# Patient Record
Sex: Female | Born: 1985 | Race: Black or African American | Hispanic: No | Marital: Married | Smoking: Never smoker
Health system: Southern US, Community
[De-identification: ages and names within clinical notes are randomized; demographics above are authoritative.]

## PROBLEM LIST (undated history)

## (undated) DIAGNOSIS — F321 Major depressive disorder, single episode, moderate: Secondary | ICD-10-CM

## (undated) DIAGNOSIS — K219 Gastro-esophageal reflux disease without esophagitis: Secondary | ICD-10-CM

## (undated) DIAGNOSIS — R011 Cardiac murmur, unspecified: Secondary | ICD-10-CM

## (undated) DIAGNOSIS — M549 Dorsalgia, unspecified: Secondary | ICD-10-CM

## (undated) DIAGNOSIS — R079 Chest pain, unspecified: Secondary | ICD-10-CM

## (undated) DIAGNOSIS — R002 Palpitations: Secondary | ICD-10-CM

## (undated) DIAGNOSIS — F32A Depression, unspecified: Secondary | ICD-10-CM

## (undated) DIAGNOSIS — F419 Anxiety disorder, unspecified: Secondary | ICD-10-CM

## (undated) DIAGNOSIS — E559 Vitamin D deficiency, unspecified: Secondary | ICD-10-CM

## (undated) DIAGNOSIS — K5792 Diverticulitis of intestine, part unspecified, without perforation or abscess without bleeding: Secondary | ICD-10-CM

## (undated) DIAGNOSIS — K829 Disease of gallbladder, unspecified: Secondary | ICD-10-CM

## (undated) DIAGNOSIS — K5900 Constipation, unspecified: Secondary | ICD-10-CM

## (undated) HISTORY — DX: Depression, unspecified: F32.A

## (undated) HISTORY — DX: Cardiac murmur, unspecified: R01.1

## (undated) HISTORY — DX: Chest pain, unspecified: R07.9

## (undated) HISTORY — DX: Diverticulitis of intestine, part unspecified, without perforation or abscess without bleeding: K57.92

## (undated) HISTORY — PX: CHOLECYSTECTOMY: SHX55

## (undated) HISTORY — DX: Palpitations: R00.2

## (undated) HISTORY — DX: Dorsalgia, unspecified: M54.9

## (undated) HISTORY — DX: Disease of gallbladder, unspecified: K82.9

## (undated) HISTORY — DX: Major depressive disorder, single episode, moderate: F32.1

## (undated) HISTORY — DX: Gastro-esophageal reflux disease without esophagitis: K21.9

## (undated) HISTORY — DX: Constipation, unspecified: K59.00

## (undated) HISTORY — DX: Anxiety disorder, unspecified: F41.9

## (undated) HISTORY — DX: Vitamin D deficiency, unspecified: E55.9

---
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
## 2006-07-15 ENCOUNTER — Ambulatory Visit (HOSPITAL_COMMUNITY): Admission: RE | Admit: 2006-07-15 | Discharge: 2006-07-15 | Payer: Self-pay | Admitting: Obstetrics

## 2006-08-05 ENCOUNTER — Ambulatory Visit (HOSPITAL_COMMUNITY): Admission: RE | Admit: 2006-08-05 | Discharge: 2006-08-05 | Payer: Self-pay | Admitting: Obstetrics

## 2007-01-11 ENCOUNTER — Inpatient Hospital Stay (HOSPITAL_COMMUNITY): Admission: RE | Admit: 2007-01-11 | Discharge: 2007-01-15 | Payer: Self-pay | Admitting: Obstetrics

## 2007-03-03 ENCOUNTER — Emergency Department (HOSPITAL_COMMUNITY): Admission: EM | Admit: 2007-03-03 | Discharge: 2007-03-03 | Payer: Self-pay | Admitting: Emergency Medicine

## 2007-05-07 ENCOUNTER — Ambulatory Visit (HOSPITAL_COMMUNITY): Admission: RE | Admit: 2007-05-07 | Discharge: 2007-05-07 | Payer: Self-pay | Admitting: General Surgery

## 2007-05-07 ENCOUNTER — Encounter (INDEPENDENT_AMBULATORY_CARE_PROVIDER_SITE_OTHER): Payer: Self-pay | Admitting: General Surgery

## 2009-06-13 ENCOUNTER — Emergency Department (HOSPITAL_COMMUNITY): Admission: EM | Admit: 2009-06-13 | Discharge: 2009-06-13 | Payer: Self-pay | Admitting: Emergency Medicine

## 2009-07-25 ENCOUNTER — Emergency Department (HOSPITAL_COMMUNITY): Admission: EM | Admit: 2009-07-25 | Discharge: 2009-07-25 | Payer: Self-pay | Admitting: Emergency Medicine

## 2010-10-28 LAB — RAPID STREP SCREEN (MED CTR MEBANE ONLY): Streptococcus, Group A Screen (Direct): POSITIVE — AB

## 2010-12-10 NOTE — Op Note (Signed)
NAMEVERLINE, Laura Mejia            ACCOUNT NO.:  [id number]   MEDICAL RECORD NO.:  [id number]          PATIENT TYPE:  AMB   LOCATION:  SDS                          FACILITY:  [facility]   PHYSICIAN:  Cherylynn Ridges, M.D.    DATE OF BIRTH:  March 19, 1986   DATE OF PROCEDURE:  05/07/2007  DATE OF DISCHARGE:  05/07/2007                               OPERATIVE REPORT   PREOPERATIVE DIAGNOSES:  Symptomatic cholelithiasis.   POSTOPERATIVE DIAGNOSES:  Symptomatic cholelithiasis.   PROCEDURE:  Laparoscopic cholecystectomy with cholangiogram.   SURGEON:  Dr. Lindie Spruce.   ASSISTANT:  Dr. Carolynne Edouard.   ANESTHESIA:  General endotracheal.   ESTIMATED BLOOD LOSS:  Less than 20 mL.   COMPLICATIONS:  None.   CONDITION:  Stable.   FINDINGS:  The patient had a gallbladder packed with gallstones.  Cholangiogram was normal.  No evidence of obstruction, filling defects  or dilatation.  She had good proximal flow.   INDICATIONS FOR OPERATION:  The patient is a 25 year old with  symptomatic gallstones postpartum who now comes in for a laparoscopic  cholecystectomy.   DESCRIPTION OF PROCEDURE:  The patient was taken to the operating room,  placed on the table in supine position.  After an adequate endotracheal  anesthetic was administered, she was prepped and draped in the usual  sterile manner exposing the midline and the right upper quadrant.   A supraumbilical curvilinear incision was made using a #11 blade and  taken down to the midline fascia.  We were able to bluntly dissect down  into the peritoneal cavity. We grabbed the fascial edges and  subsequently passed a pursestring suture of #0 Vicryl around the fascial  opening.  We then passed a Hassan cannula into the peritoneal cavity  with minimal difficulty, securing it in place with a pursestring suture.  Once this was done, carbon dioxide gas was instilled through the [facility]  cannula into the peritoneal cavity up to a maximal intra-abdominal  pressure  of 15 mmHg.  Once this was done, two right costal margin 5-mm  cannulas and a subxiphoid 12-mm cannula were passed under direct vision.  With all of these in place, the patient was placed in reverse  Trendelenburg, the left-side was tilted down and the dissection begun.   We retracted the gallbladder towards the right upper quadrant and the  anterior abdominal wall using graspers and then dissected out the  peritoneum overlying the triangle of Calot and hepatoduodenal triangle.  We isolated the cystic duct and the cystic artery, clipped the cystic  artery proximally and distally and transected it.  We placed a clip  along the gallbladder side of the cystic duct and made  cholecystodochotomy through which a Cook catheter which had been passed  through the anterior abdominal wall was used to cannulate to perform the  cholangiogram.  The cholangiogram showed good flow into the duodenum, no  obstruction, no dilatation of stones.   Once the cholangiogram was completed, we removed the clip securing  it  in place, removed the catheter and distally clipped the cystic duct x2.  We transected the cystic duct and dissected  out the gallbladder from its  bed with minimal difficulty.  We brought it up to the fascial level  where we opened the gallbladder and retrieve a large number of stones  which were impeding its extraction.  Once this was done, the gallbladder  was removed, we irrigated with a small amount of saline and removed all  fluid and gas from above the liver.  Once this was done all cannulas  were removed.  We closed the supraumbilical site with the #0 Vicryl  suture which was then placed with a pursestring and we actually placed  two additional simple stitches for closure.  Once this was done we  injected 0.25% Marcaine with epi at all sites.  We closed the skin at  the supraumbilical and the subxiphoid sites using running subcuticular  stitch of 4-0 Vicryl and then we closed the skin  using Dermabond at all  sites. A full dressing including Dermabond, Steri-Strips and Tegaderm.  All counts were correct.      Cherylynn Ridges, M.D.  Electronically Signed     JOW/MEDQ  D:  05/07/2007  T:  05/08/2007  Job:  [id number]

## 2010-12-10 NOTE — H&P (Signed)
NAME:  Laura Mejia, EK NO.:  [id number]   MEDICAL RECORD NO.:  [id number]          PATIENT TYPE:  INP   LOCATION:  9103                          FACILITY:  [facility]   PHYSICIAN:  Kathreen Cosier, M.D.DATE OF BIRTH:  1985-10-09   DATE OF ADMISSION:  01/11/2007  DATE OF DISCHARGE:                              HISTORY & PHYSICAL   The patient is a 25 year old gravida 2, para 1-0-0-1, [facility] January 06, 2007.  She desired induction post dates. Negative GBS. Cervix 1 cm, 70%,  vertex, -2 to 3 station. Membranes were ruptured at 7:10 a.m.  artificially. The fluid was clear, and she was having irregular  contractions. She was started on low-dose Pitocin and took the maximum  of 4 doses. By 5 p.m., she was 9 cm, 100%, vertex, -1. She was on 2  milliunits of Pitocin. Temperature was 100.5. She was started on  ampicillin 2 grams IV every 6 hours. By 11:30 p.m., she had a rim  anteriorly and on the left side, and she had previously tried attempt at  pushing, and cervix could not be reduced. We restarted pushing at 11  p.m. and pushed for an hour. The cervix was reduced, but the vertex  would not come below 0 station. It was decided she would be delivered by  cesarean section for failure to progress.   PHYSICAL EXAMINATION:  Revealed a well-developed female in no distress.  HEENT:  Negative.  LUNGS:  Clear.  HEART:  Regular rhythm. No murmurs. No gallops.  BREASTS:  No masses.  ABDOMEN:  __________  estimated fetal weight of 6 pounds 14 ounces.  EXTREMITIES:  Negative.           ______________________________  Kathreen Cosier, M.D.     BAM/MEDQ  D:  01/12/2007  T:  01/12/2007  Job:  [id number]

## 2010-12-10 NOTE — Op Note (Signed)
NAMEMarland Kitchen  Mejia, Laura Mejia NO.:  [id number]   MEDICAL RECORD NO.:  [id number]          PATIENT TYPE:  INP   LOCATION:  9103                          FACILITY[facility]  [facility]   PHYSICIAN:  Kathreen Cosier, M.D.DATE OF BIRTH:  [birth year]   DATE OF PROCEDURE:  01/12/2007  DATE OF DISCHARGE:                               OPERATIVE REPORT   SURGEON:  Dr. Gaynell Face.   ANESTHESIA:  Epidural.   PROCEDURE:  The patient placed on the operating table in supine  position. Abdomen prepped and draped. Bladder emptied with a Foley  catheter. Transverse suprapubic incision made, carried down to the  rectus fascia. Fascia cleanly incised the length of the incision. The  recti muscles were retracted laterally. Peritoneum incised  longitudinally. Transverse incision made in the visceral peritoneum  above the bladder. Bladder mobilized inferiorly. Transverse lower  uterine incision made. The patient delivered from the OP position.  Apgars of 8 and 9, weighing 8 pounds 8 ounces. The team was in  attendance. The fluid was clear. Placenta was posterior, removed  manually and sent to labor and delivery. The uterine cavity was cleaned  with dry laps. The uterine incision closed in one layer with continuous  suture of #1 chromic. Hemostasis satisfactory. Bladder flap reattached  with 2-0 chromic. Uterus __________  tubes and ovaries normal. Abdomen  closed in layers, peritoneum continuous suture of 0 chromic, fascia  continuous suture of 0 Dexon and the skin closed with subcuticular  stitch of 4-0 Monocryl. Blood loss 500 mL. The patient tolerated the  procedure well, taken to the recovery room in good condition.           ______________________________  Kathreen Cosier, M.D.     BAM/MEDQ  D:  01/12/2007  T:  01/12/2007  Job:  [id number]

## 2010-12-13 NOTE — Discharge Summary (Signed)
NAME:  Laura Mejia, TEP NO.:  [id number]   MEDICAL RECORD NO.:  [id number]          PATIENT TYPE:  INP   LOCATION:  9103                          [facility]ACILITY[facility]  [facility]   PHYSICIAN:  Kathreen Cosier, M.D.DATE OF BIRTH:  Dec 11, 1985   DATE OF ADMISSION:  01/11/2007  DATE OF DISCHARGE:  01/15/2007                               DISCHARGE SUMMARY   The patient is a 25 year old gravida 2, para 1-0-0-1, [facility] January 06, 2007  desired induction at term, negative GBS.  Cervix 1 cm, 70%, vertex, -2  to -3.  Membranes ruptured artificially. She was having irregular  contractions and the patient progressed to full dilatation, 0 station  and pushed for a total of 145 minutes with no descend below 0 station.   HOSPITAL COURSE:  The patient was delivered by C-section and had a female,  Apgar 08/09, weighing 8 pounds 8 ounces from the OP position.  Blood  loss was 500 mL.  The patient did well.  Her hemoglobin was 6.7.  She  was given ferrous sulfate twice a day and was discharged home on the  third postoperative day on ferrous sulfate 250 mg p.o. b.i.d. and Tylox  for pain.   DISCHARGE DIAGNOSIS:  Status post primary low transverse cesarean  section at term, failed induction of labor.           ______________________________  Kathreen Cosier, M.D.     BAM/MEDQ  D:  02/10/2007  T:  02/10/2007  Job:  [id number]

## 2010-12-28 ENCOUNTER — Emergency Department (HOSPITAL_COMMUNITY)
Admission: EM | Admit: 2010-12-28 | Discharge: 2010-12-28 | Disposition: A | Discharge status: Home / Self Care | Payer: Medicaid Other | Attending: Emergency Medicine | Admitting: Emergency Medicine

## 2010-12-28 DIAGNOSIS — R51 Headache: Secondary | ICD-10-CM | POA: Insufficient documentation

## 2010-12-28 DIAGNOSIS — H6120 Impacted cerumen, unspecified ear: Secondary | ICD-10-CM | POA: Insufficient documentation

## 2010-12-28 DIAGNOSIS — H9209 Otalgia, unspecified ear: Secondary | ICD-10-CM | POA: Insufficient documentation

## 2011-05-08 LAB — DIFFERENTIAL
Basophils Absolute: 0.1
Basophils Relative: 1
Eosinophils Absolute: 0.3
Eosinophils Relative: 4
Lymphocytes Relative: 40
Lymphs Abs: 2.7
Monocytes Absolute: 0.5
Monocytes Relative: 7
Neutro Abs: 3.1
Neutrophils Relative %: 48

## 2011-05-08 LAB — CBC
HCT: 32.7 — ABNORMAL LOW
Hemoglobin: 10.1 — ABNORMAL LOW
MCHC: 31
MCV: 63.1 — ABNORMAL LOW
Platelets: 476 — ABNORMAL HIGH
RBC: 5.18 — ABNORMAL HIGH
RDW: 18.9 — ABNORMAL HIGH
WBC: 6.7

## 2011-05-08 LAB — COMPREHENSIVE METABOLIC PANEL
ALT: 23
AST: 25
Albumin: 4.1
Alkaline Phosphatase: 44
BUN: 9
CO2: 26
Calcium: 9.9
Chloride: 104
Creatinine, Ser: 0.81
GFR calc Af Amer: >60
GFR calc non Af Amer: >60
Glucose, Bld: 82
Potassium: 4.3
Sodium: 138
Total Bilirubin: 0.5
Total Protein: 7.2

## 2011-05-12 LAB — CBC
HCT: 29.3 — ABNORMAL LOW
Hemoglobin: 9.4 — ABNORMAL LOW
MCHC: 32
MCV: 62.9 — ABNORMAL LOW
Platelets: 511 — ABNORMAL HIGH
RBC: 4.66
RDW: 18.2 — ABNORMAL HIGH
WBC: 8.2

## 2011-05-12 LAB — COMPREHENSIVE METABOLIC PANEL
ALT: 21
AST: 20
Albumin: 3.9
Alkaline Phosphatase: 49
BUN: 10
CO2: 24
Calcium: 9.5
Chloride: 104
Creatinine, Ser: 0.78
GFR calc Af Amer: >60
GFR calc non Af Amer: >60
Glucose, Bld: 87
Potassium: 4
Sodium: 137
Total Bilirubin: 0.2 — ABNORMAL LOW
Total Protein: 7.4

## 2011-05-12 LAB — PREGNANCY, URINE: Preg Test, Ur: NEGATIVE

## 2011-05-12 LAB — URINALYSIS, ROUTINE W REFLEX MICROSCOPIC
Bilirubin Urine: NEGATIVE
Glucose, UA: NEGATIVE
Hgb urine dipstick: NEGATIVE
Nitrite: NEGATIVE
Protein, ur: NEGATIVE
Specific Gravity, Urine: 1.018
Urobilinogen, UA: 0.2
pH: 6

## 2011-05-12 LAB — DIFFERENTIAL
Basophils Absolute: 0.1
Basophils Relative: 1
Eosinophils Absolute: 0.2
Eosinophils Relative: 2
Lymphocytes Relative: 33
Lymphs Abs: 2.7
Monocytes Absolute: 0.4
Monocytes Relative: 5
Neutro Abs: 4.8
Neutrophils Relative %: 59

## 2011-05-12 LAB — AMYLASE: Amylase: 54

## 2011-05-12 LAB — LIPASE, BLOOD: Lipase: 23

## 2011-05-12 LAB — URINE MICROSCOPIC-ADD ON

## 2011-05-14 LAB — CBC
HCT: 20.9 — ABNORMAL LOW
HCT: 26.8 — ABNORMAL LOW
Hemoglobin: 6.7 — CL
Hemoglobin: 8.6 — ABNORMAL LOW
MCHC: 31.9
MCHC: 32.2
MCV: 65.5 — ABNORMAL LOW
MCV: 66.2 — ABNORMAL LOW
Platelets: 321
Platelets: 446 — ABNORMAL HIGH
RBC: 3.16 — ABNORMAL LOW
RBC: 4.09
RDW: 18.3 — ABNORMAL HIGH
RDW: 19.2 — ABNORMAL HIGH
WBC: 11.3 — ABNORMAL HIGH
WBC: 8.3

## 2011-05-14 LAB — CCBB MATERNAL DONOR DRAW

## 2011-05-14 LAB — RPR: RPR Ser Ql: NONREACTIVE

## 2011-09-09 ENCOUNTER — Other Ambulatory Visit: Payer: Self-pay

## 2011-09-09 ENCOUNTER — Encounter (HOSPITAL_COMMUNITY): Payer: Self-pay | Admitting: *Deleted

## 2011-09-09 ENCOUNTER — Emergency Department (HOSPITAL_COMMUNITY): Payer: Medicaid Other

## 2011-09-09 ENCOUNTER — Emergency Department (HOSPITAL_COMMUNITY)
Admission: EM | Admit: 2011-09-09 | Discharge: 2011-09-09 | Disposition: A | Discharge status: Home / Self Care | Payer: Medicaid Other | Attending: Emergency Medicine | Admitting: Emergency Medicine

## 2011-09-09 DIAGNOSIS — R0989 Other specified symptoms and signs involving the circulatory and respiratory systems: Secondary | ICD-10-CM | POA: Insufficient documentation

## 2011-09-09 DIAGNOSIS — R002 Palpitations: Secondary | ICD-10-CM | POA: Insufficient documentation

## 2011-09-09 DIAGNOSIS — R079 Chest pain, unspecified: Secondary | ICD-10-CM | POA: Insufficient documentation

## 2011-09-09 DIAGNOSIS — R0602 Shortness of breath: Secondary | ICD-10-CM | POA: Insufficient documentation

## 2011-09-09 DIAGNOSIS — R0609 Other forms of dyspnea: Secondary | ICD-10-CM | POA: Insufficient documentation

## 2011-09-09 LAB — POCT I-STAT TROPONIN I: Troponin i, poc: 0 ng/mL (ref 0.00–0.08)

## 2011-09-09 LAB — DIFFERENTIAL
Basophils Absolute: 0 10*3/uL (ref 0.0–0.1)
Basophils Relative: 0 % (ref 0–1)
Eosinophils Absolute: 0.2 10*3/uL (ref 0.0–0.7)
Eosinophils Relative: 2 % (ref 0–5)
Lymphocytes Relative: 31 % (ref 12–46)
Lymphs Abs: 3.4 10*3/uL (ref 0.7–4.0)
Monocytes Absolute: 0.6 10*3/uL (ref 0.1–1.0)
Monocytes Relative: 6 % (ref 3–12)
Neutro Abs: 6.8 10*3/uL (ref 1.7–7.7)
Neutrophils Relative %: 61 % (ref 43–77)

## 2011-09-09 LAB — CBC
HCT: 35.6 % — ABNORMAL LOW (ref 36.0–46.0)
Hemoglobin: 11.5 g/dL — ABNORMAL LOW (ref 12.0–15.0)
MCH: 23.7 pg — ABNORMAL LOW (ref 26.0–34.0)
MCHC: 32.3 g/dL (ref 30.0–36.0)
MCV: 73.4 fL — ABNORMAL LOW (ref 78.0–100.0)
Platelets: 387 10*3/uL (ref 150–400)
RBC: 4.85 MIL/uL (ref 3.87–5.11)
RDW: 15.8 % — ABNORMAL HIGH (ref 11.5–15.5)
WBC: 11 10*3/uL — ABNORMAL HIGH (ref 4.0–10.5)

## 2011-09-09 LAB — COMPREHENSIVE METABOLIC PANEL
ALT: 16 U/L (ref 0–35)
AST: 17 U/L (ref 0–37)
Albumin: 3.9 g/dL (ref 3.5–5.2)
Alkaline Phosphatase: 54 U/L (ref 39–117)
BUN: 9 mg/dL (ref 6–23)
CO2: 24 mEq/L (ref 19–32)
Calcium: 10 mg/dL (ref 8.4–10.5)
Chloride: 105 mEq/L (ref 96–112)
Creatinine, Ser: 0.8 mg/dL (ref 0.50–1.10)
GFR calc Af Amer: >90 mL/min (ref >90)
GFR calc non Af Amer: >90 mL/min (ref >90)
Glucose, Bld: 89 mg/dL (ref 70–99)
Potassium: 3.5 mEq/L (ref 3.5–5.1)
Sodium: 140 mEq/L (ref 135–145)
Total Bilirubin: 0.2 mg/dL — ABNORMAL LOW (ref 0.3–1.2)
Total Protein: 7.9 g/dL (ref 6.0–8.3)

## 2011-09-09 LAB — D-DIMER, QUANTITATIVE: D-Dimer, Quant: 0.58 ug/mL-FEU — ABNORMAL HIGH (ref 0.00–0.48)

## 2011-09-09 MED ORDER — IOHEXOL 300 MG/ML  SOLN
100.0000 mL | Freq: Once | INTRAMUSCULAR | Status: AC | PRN
  Administered 2011-09-09: 71 mL via INTRAVENOUS

## 2011-09-09 MED ORDER — HYDROCODONE-ACETAMINOPHEN 5-325 MG PO TABS: 1.0000 | ORAL_TABLET | Freq: Four times a day (QID) | ORAL | Status: AC | PRN

## 2011-09-09 NOTE — ED Notes (Signed)
Pt reports feeling palpitations x 1 week, intermittant chest pain x 1 week, weakness/dizziness x 1 week. Reports nausea x 1 week, no vomiting. Intermittant shortness of breath, no diaphoresis.

## 2011-09-09 NOTE — ED Notes (Signed)
Patient transported to X-ray 

## 2011-09-09 NOTE — ED Notes (Signed)
Pt states SOB, pressure in her L chest, dizziness and fatigue over past 7 days, happens every day x 7 days, denies syncope.

## 2011-09-09 NOTE — Discharge Instructions (Signed)
Follow up with [facility] or another doctor in 1-2 weeks

## 2011-09-09 NOTE — ED Notes (Signed)
Pt returned from CT scan.

## 2011-09-09 NOTE — ED Provider Notes (Signed)
History     CSN: [id number]  Arrival date & time 09/09/11  1338   First MD Initiated Contact with Patient 09/09/11 1508      Chief Complaint  Patient presents with  . Palpitations    (Consider location/radiation/quality/duration/timing/severity/associated sxs/prior treatment) Patient is a 26 y.o. female presenting with palpitations. The history is provided by the patient (The patient complains of chest pain and some palpitations. This has been going on for a day or 2 the pain only lasts for a few minutes. No shortness of breath). No language interpreter was used.  Palpitations  This is a recurrent problem. The current episode started 12 to 24 hours ago. The problem occurs rarely. The problem has not changed since onset.The problem is associated with stress. Associated symptoms include chest pain. Pertinent negatives include no orthopnea, no abdominal pain, no headaches, no back pain and no cough. She has tried nothing for the symptoms. The treatment provided no relief. Risk factors include no known risk factors. Her past medical history does not include hyperthyroidism.    History reviewed. No pertinent past medical history.  Past Surgical History  Procedure Date  . Cholecystectomy   . Cesarean section     No family history on file.  History  Substance Use Topics  . Smoking status: Never Smoker   . Smokeless tobacco: Not on file  . Alcohol Use: No    OB History    Grav Para Term Preterm Abortions TAB SAB Ect Mult Living                  Review of Systems  Constitutional: Negative for fatigue.  HENT: Negative for congestion, sinus pressure and ear discharge.   Eyes: Negative for discharge.  Respiratory: Negative for cough.   Cardiovascular: Positive for chest pain and palpitations. Negative for orthopnea.  Gastrointestinal: Negative for abdominal pain and diarrhea.  Genitourinary: Negative for frequency and hematuria.  Musculoskeletal: Negative for back pain.  Skin:  Negative for rash.  Neurological: Negative for seizures and headaches.  Hematological: Negative.   Psychiatric/Behavioral: Negative for hallucinations.    Allergies  Review of patient's allergies indicates no known allergies.  Home Medications   Current Outpatient Rx  Name Route Sig Dispense Refill  . HYDROCODONE-ACETAMINOPHEN 5-325 MG PO TABS Oral Take 1 tablet by mouth every 6 (six) hours as needed for pain. 15 tablet 0    BP 129/69  Pulse 85  Temp(Src) 98.7 F (37.1 C) (Oral)  Resp 16  SpO2 100%  Physical Exam  Constitutional: She is oriented to person, place, and time. She appears well-developed.  HENT:  Head: Normocephalic and atraumatic.  Eyes: Conjunctivae and EOM are normal. No scleral icterus.  Neck: Neck supple. No thyromegaly present.  Cardiovascular: Normal rate and regular rhythm.  Exam reveals no gallop and no friction rub.   No murmur heard. Pulmonary/Chest: No stridor. She has no wheezes. She has no rales. She exhibits no tenderness.  Abdominal: She exhibits no distension. There is no tenderness. There is no rebound.  Musculoskeletal: Normal range of motion. She exhibits no edema.  Lymphadenopathy:    She has no cervical adenopathy.  Neurological: She is oriented to person, place, and time. Coordination normal.  Skin: No rash noted. No erythema.  Psychiatric: She has a normal mood and affect. Her behavior is normal.    ED Course  Procedures (including critical care time)  Labs Reviewed  CBC - Abnormal; Notable for the following:    WBC 11.0 (*)  Hemoglobin 11.5 (*)    HCT 35.6 (*)    MCV 73.4 (*)    MCH 23.7 (*)    RDW 15.8 (*)    All other components within normal limits  D-DIMER, QUANTITATIVE - Abnormal; Notable for the following:    D-Dimer, Quant 0.58 (*)    All other components within normal limits  COMPREHENSIVE METABOLIC PANEL - Abnormal; Notable for the following:    Total Bilirubin 0.2 (*)    All other components within normal  limits  DIFFERENTIAL  POCT I-STAT TROPONIN I   Dg Chest 2 View  09/09/2011  *RADIOLOGY REPORT*  Clinical Data: Dyspnea.  Nonsmoker.  CHEST - 2 VIEW  Comparison: No priors.  Findings: Lung volumes are normal.  No consolidative airspace disease.  No pleural effusions.  No pneumothorax.  No pulmonary nodule or mass noted.  Pulmonary vasculature and the cardiomediastinal silhouette are within normal limits.  Surgical clips project over the right upper quadrant of the abdomen, presumably from prior cholecystectomy.  Small ovoid shaped densities project over the suprahilar regions bilaterally and appear symmetric; upon close inspection these appear compatible with small buttons, presumably exterior to the patient.  IMPRESSION: 1. No radiographic evidence of acute cardiopulmonary disease.  Original Report Authenticated By: Florencia Reasons, M.D.   Ct Angio Chest W/cm &/or Wo Cm  09/09/2011  *RADIOLOGY REPORT*  Clinical Data: Rule out pulmonary embolus.  Shortness of breath for 1 week.  CT ANGIOGRAPHY CHEST  Technique:  Multidetector CT imaging of the chest using the standard protocol during bolus administration of intravenous contrast. Multiplanar reconstructed images including MIPs were obtained and reviewed to evaluate the vascular anatomy.  Contrast: 71mL OMNIPAQUE IOHEXOL 300 MG/ML IV SOLN  Comparison: None.  Findings: No enlarged axillary or supraclavicular adenopathy.  There is no enlarged mediastinal or hilar adenopathy.  No pericardial or pleural effusion identified.  Heart size appears normal.  The lungs are clear.  No suspicious pulmonary nodule or mass identified.  There is no abnormal filling defect within the main pulmonary artery or their branches to suggest an acute pulmonary embolus.  Limited imaging through the upper abdomen is negative.  IMPRESSION:  1.  No acute cardiopulmonary abnormalities. 2.  No evidence for pulmonary embolus.  Original Report Authenticated By: Rosealee Albee, M.D.      1. Palpitations      Date: 09/09/2011  Rate: 80  Rhythm: normal sinus rhythm  QRS Axis: normal  Intervals: normal  ST/T Wave abnormalities: normal  Conduction Disutrbances:none  Narrative Interpretation:   Old EKG Reviewed: none available    MDM          Benny Lennert, MD 09/09/11 2049

## 2011-09-22 ENCOUNTER — Encounter: Payer: Self-pay | Admitting: Cardiovascular Disease

## 2011-09-22 ENCOUNTER — Ambulatory Visit (INDEPENDENT_AMBULATORY_CARE_PROVIDER_SITE_OTHER): Payer: Medicaid Other | Admitting: Cardiovascular Disease

## 2011-09-22 VITALS — BP 132/81 | HR 61 | Ht 62.0 in | Wt 198.0 lb

## 2011-09-22 DIAGNOSIS — R002 Palpitations: Secondary | ICD-10-CM | POA: Insufficient documentation

## 2011-09-22 DIAGNOSIS — R011 Cardiac murmur, unspecified: Secondary | ICD-10-CM

## 2011-09-22 NOTE — Progress Notes (Signed)
   History of Present Illness: 26 yo AAF with no significant past medical history who is referred today for evaluation of palpitations. She was seen in the [facility] ED 09/09/11 with c/o palpitations. Her EKG showed NSR. D-dimer mildly elevated but CTA negative for PE. She tells me that she has had a heart murmur her entire life. She has had no previous cardiac workup. She describes daily episodes of palpitations that occur at rest or with activity. These last for 2 minutes. Described as skipped beats and racing. This has been occurring for at least several months. No dizziness, near syncope or syncope but she does feel breathless and there is some chest tightness.   Primary Care Physician: None   Past Medical History  Diagnosis Date  . Palpitations   . Heart murmur     Past Surgical History  Procedure Date  . Cholecystectomy   . Cesarean section     No current outpatient prescriptions on file.    No Known Allergies  History   Social History  . Marital Status: Married    Spouse Name: N/A    Number of Children: 2  . Years of Education: N/A   Occupational History  . Student    Social History Main Topics  . Smoking status: Never Smoker   . Smokeless tobacco: Never Used  . Alcohol Use: No  . Drug Use: No  . Sexually Active: Not on file   Other Topics Concern  . Not on file   Social History Narrative  . No narrative on file    History reviewed. No pertinent family history.  Review of Systems:  As stated in the HPI and otherwise negative.   BP 132/81  Pulse 61  Ht 5\' 2"  (1.575 m)  Wt 198 lb (89.812 kg)  BMI 36.21 kg/m2  Physical Examination: General: Well developed, well nourished, NAD HEENT: OP clear, mucus membranes moist SKIN: warm, dry. No rashes. Neuro: No focal deficits Musculoskeletal: Muscle strength 5/5 all ext Psychiatric: Mood and affect normal Neck: No JVD, no carotid bruits, no thyromegaly, no lymphadenopathy. Lungs:Clear bilaterally, no  wheezes, rhonci, crackles Cardiovascular: Regular rate and rhythm. Blowing systolic murmurs. No gallops or rubs. Abdomen:Soft. Bowel sounds present. Non-tender.  Extremities: No lower extremity edema. Pulses are 2 + in the bilateral DP/PT.  EKG:NSR, rate 72 bpm. Non-specific

## 2011-09-22 NOTE — Patient Instructions (Addendum)
Your physician recommends that you schedule a follow-up appointment in:  4 weeks.   Your physician has requested that you have an echocardiogram. Echocardiography is a painless test that uses sound waves to create images of your heart. It provides your doctor with information about the size and shape of your heart and how well your heart's chambers and valves are working. This procedure takes approximately one hour. There are no restrictions for this procedure.   Your physician has recommended that you wear a holter monitor. Holter monitors are medical devices that record the heart's electrical activity. Doctors most often use these monitors to diagnose arrhythmias. Arrhythmias are problems with the speed or rhythm of the heartbeat. The monitor is a small, portable device. You can wear one while you do your normal daily activities. This is usually used to diagnose what is causing palpitations/syncope (passing out).  . 

## 2011-09-22 NOTE — Assessment & Plan Note (Addendum)
Will arrange 48 hour Holter monitor to exclude malignant arrythmias. Will check echo to exclude structural heart disease. Pt instructed to avoid stimulants.

## 2011-09-22 NOTE — Assessment & Plan Note (Signed)
Will arrange echo to assess.

## 2011-10-01 ENCOUNTER — Ambulatory Visit (HOSPITAL_COMMUNITY): Payer: Medicaid Other | Attending: Cardiology

## 2011-10-01 ENCOUNTER — Encounter (INDEPENDENT_AMBULATORY_CARE_PROVIDER_SITE_OTHER): Payer: Medicaid Other

## 2011-10-01 ENCOUNTER — Other Ambulatory Visit: Payer: Self-pay

## 2011-10-01 DIAGNOSIS — R002 Palpitations: Secondary | ICD-10-CM

## 2011-10-01 DIAGNOSIS — R011 Cardiac murmur, unspecified: Secondary | ICD-10-CM | POA: Insufficient documentation

## 2011-10-20 ENCOUNTER — Ambulatory Visit (INDEPENDENT_AMBULATORY_CARE_PROVIDER_SITE_OTHER): Payer: Medicaid Other | Admitting: Cardiovascular Disease

## 2011-10-20 ENCOUNTER — Encounter: Payer: Self-pay | Admitting: Cardiovascular Disease

## 2011-10-20 VITALS — BP 124/74 | HR 78 | Ht 62.0 in | Wt 200.8 lb

## 2011-10-20 DIAGNOSIS — I491 Atrial premature depolarization: Secondary | ICD-10-CM

## 2011-10-20 NOTE — Progress Notes (Signed)
   History of Present Illness: 26 yo AAF with no significant past medical history who is here today for cardiac follow up. I saw her one month ago for evaluation of palpitations. She was seen in the [facility] 09/09/11 with c/o palpitations. Her EKG showed NSR. D-dimer mildly elevated but CTA negative for PE. She told me that she has had a heart murmur her entire life. She has had no previous cardiac workup. She described daily episodes of palpitations that occur at rest or with activity. These last for 2 minutes. Described as skipped beats and racing. This has been occurring for at least several months. No dizziness, near syncope or syncope but she does feel breathless and there is some chest tightness. I arranged a 48 hour Holter monitor which showed NSR with PACs but no PVCs, VT or atrial tachycardia. I also arranged an echo which showed normal LV size and function with no valvular abnormalities.   She is here today for follow up. She is doing well. She occasionally feels palpitations. No chest pain or SOB.   Primary Care Physician: None  Past Medical History  Diagnosis Date  . Palpitations   . Heart murmur     Past Surgical History  Procedure Date  . Cholecystectomy   . Cesarean section     No current outpatient prescriptions on file.    No Known Allergies  History   Social History  . Marital Status: Married    Spouse Name: N/A    Number of Children: 2  . Years of Education: N/A   Occupational History  . Student    Social History Main Topics  . Smoking status: Never Smoker   . Smokeless tobacco: Never Used  . Alcohol Use: No  . Drug Use: No  . Sexually Active: Not on file   Other Topics Concern  . Not on file   Social History Narrative  . No narrative on file    No family history on file.  Review of Systems:  As stated in the HPI and otherwise negative.   BP 124/74  Pulse 78  Ht 5\' 2"  (1.575 m)  Wt 200 lb 12.8 oz (91.082 kg)  BMI 36.73 kg/m2  Physical  Examination: General: Well developed, well nourished, NAD HEENT: OP clear, mucus membranes moist SKIN: warm, dry. No rashes. Neuro: No focal deficits Musculoskeletal: Muscle strength 5/5 all ext Psychiatric: Mood and affect normal Neck: No JVD, no carotid bruits, no thyromegaly, no lymphadenopathy. Lungs:Clear bilaterally, no wheezes, rhonci, crackles Cardiovascular: Regular rate and rhythm. No murmurs, gallops or rubs. Abdomen:Soft. Bowel sounds present. Non-tender.  Extremities: No lower extremity edema. Pulses are 2 + in the bilateral DP/PT.  Echo: 10/01/11:  Left ventricle: The cavity size was normal. Wall thickness was normal. Systolic function was normal. The estimated ejection fraction was in the range of 60% to 65%. Wall motion was normal; there were no regional wall motion abnormalities. Left ventricular diastolic function parameters were normal. - Pulmonary arteries: Systolic pressure was mildly increased. PA peak pressure: 33mm Hg (S).

## 2011-10-20 NOTE — Patient Instructions (Signed)
Your physician wants you to follow-up in:  12 months.  You will receive a reminder letter in the mail two months in advance. If you don't receive a letter, please call our office to schedule the follow-up appointment.   

## 2011-10-20 NOTE — Assessment & Plan Note (Signed)
Likely cause of her palpitations. She is advised to avoid stimulants such as caffeine, nicotine, over the counter cold medications or energy drinks. No further workup at this time. No medications will be added.

## 2012-07-27 ENCOUNTER — Encounter (HOSPITAL_COMMUNITY): Payer: Self-pay | Admitting: *Deleted

## 2012-07-27 ENCOUNTER — Emergency Department (HOSPITAL_COMMUNITY): Payer: Self-pay

## 2012-07-27 ENCOUNTER — Emergency Department (HOSPITAL_COMMUNITY)
Admission: EM | Admit: 2012-07-27 | Discharge: 2012-07-27 | Disposition: A | Discharge status: Home / Self Care | Payer: Self-pay | Attending: Emergency Medicine | Admitting: Emergency Medicine

## 2012-07-27 DIAGNOSIS — J209 Acute bronchitis, unspecified: Secondary | ICD-10-CM | POA: Insufficient documentation

## 2012-07-27 DIAGNOSIS — Z8679 Personal history of other diseases of the circulatory system: Secondary | ICD-10-CM | POA: Insufficient documentation

## 2012-07-27 DIAGNOSIS — R011 Cardiac murmur, unspecified: Secondary | ICD-10-CM | POA: Insufficient documentation

## 2012-07-27 MED ORDER — TRAMADOL HCL 50 MG PO TABS: 50.0000 mg | ORAL_TABLET | Freq: Four times a day (QID) | ORAL | Status: DC | PRN

## 2012-07-27 MED ORDER — HYDROCODONE-HOMATROPINE 5-1.5 MG/5ML PO SYRP: 5.0000 mL | ORAL_SOLUTION | Freq: Four times a day (QID) | ORAL | Status: DC | PRN

## 2012-07-27 NOTE — ED Notes (Signed)
Pt c/o cough x 8 days; fever; stomach virus two days ago

## 2012-07-27 NOTE — ED Provider Notes (Signed)
History     CSN: [id number]  Arrival date & time 07/27/12  0245   First MD Initiated Contact with Patient 07/27/12 [phone]      Chief Complaint  Patient presents with  . Cough    (Consider location/radiation/quality/duration/timing/severity/associated sxs/prior treatment) HPI  Laura Mejia is a 26 y.o. female complaining of dry cough for 8 days. The cough does not prevent her from sleep. Patient initially had a fever but is that has now resolved. Patient denies any nausea vomiting, abdominal pain, shortness of breath. She did have an episode of emesis 2 days ago. Patient also reports a left jaw pain. Denies decreased range of motion   Past Medical History  Diagnosis Date  . Palpitations   . Heart murmur     Past Surgical History  Procedure Date  . Cholecystectomy   . Cesarean section     Family History  Problem Relation Age of Onset  . Hypertension Father     History  Substance Use Topics  . Smoking status: Never Smoker   . Smokeless tobacco: Never Used  . Alcohol Use: No    OB History    Grav Para Term Preterm Abortions TAB SAB Ect Mult Living                  Review of Systems  Constitutional: Negative for fever.  Respiratory: Positive for cough. Negative for shortness of breath.   Cardiovascular: Negative for chest pain.  Gastrointestinal: Negative for nausea, vomiting, abdominal pain and diarrhea.  All other systems reviewed and are negative.    Allergies  Review of patient's allergies indicates no known allergies.  Home Medications   Current Outpatient Rx  Name  Route  Sig  Dispense  Refill  . ASPIRIN EFFERVESCENT 325 MG PO TBEF   Oral   Take 325 mg by mouth every 6 (six) hours as needed. For congestion or cold symptoms         . GUAIFENESIN ER 600 MG PO TB12   Oral   Take 1,200 mg by mouth 2 (two) times daily as needed. for cough         . PHENYLEPHRINE-DM-GG-APAP 5-10-200-325 MG PO TABS   Oral   Take 2 tablets by mouth 2 (two)  times daily as needed. For congestion or cold symptoms           BP 148/89  Pulse 115  Temp 99.7 F (37.6 C)  Resp 24  SpO2 95%  LMP 07/02/2012  Physical Exam  Nursing note and vitals reviewed. Constitutional: She is oriented to person, place, and time. She appears well-developed and well-nourished. No distress.  HENT:  Head: Normocephalic.  Mouth/Throat: Oropharynx is clear and moist.       Full range of motion to bilateral TMJ with no popping or clicking.  Eyes: Conjunctivae normal and EOM are normal.  Cardiovascular: Normal rate and regular rhythm.        Heart rate 90  Pulmonary/Chest: Effort normal and breath sounds normal. No stridor. No respiratory distress. She has no wheezes. She has no rales. She exhibits no tenderness.  Abdominal: Soft. There is no tenderness.  Musculoskeletal: Normal range of motion.  Lymphadenopathy:    She has no cervical adenopathy.  Neurological: She is alert and oriented to person, place, and time.  Psychiatric: She has a normal mood and affect.    ED Course  Procedures (including critical care time)  Labs Reviewed - No data to display Dg Chest 2 View  07/27/2012  *RADIOLOGY REPORT*  Clinical Data: Cough.  CHEST - 2 VIEW  Comparison: Chest radiograph performed 09/09/2011  Findings: The lungs are well-aerated and clear.  There is no evidence of focal opacification, pleural effusion or pneumothorax.  The heart is normal in size; the mediastinal contour is within normal limits.  No acute osseous abnormalities are seen.  Clips are noted within the right upper quadrant, reflecting prior cholecystectomy.  IMPRESSION: No acute cardiopulmonary process seen.   Original Report Authenticated By: Tonia Ghent, M.D.      1. Acute bronchitis       MDM  Patient with clear lung sounds and dry cough for 8 days following viral illness. Chest x-ray is clear.  I will give her tramadol for her jaw pain.   Pt verbalized understanding and agrees with  care plan. Outpatient follow-up and return precautions given.    New Prescriptions   HYDROCODONE-HOMATROPINE (HYCODAN) 5-1.5 MG/5ML SYRUP    Take 5 mLs by mouth every 6 (six) hours as needed for cough.   TRAMADOL (ULTRAM) 50 MG TABLET    Take 1 tablet (50 mg total) by mouth every 6 (six) hours as needed for pain.          Wynetta Emery, PA-C 07/27/12 2112

## 2012-07-27 NOTE — ED Provider Notes (Signed)
Medical screening examination/treatment/procedure(s) were performed by non-physician practitioner and as supervising physician I was immediately available for consultation/collaboration.  Olga M Otter, MD 07/27/12 2147 

## 2014-02-16 LAB — CYTOLOGY - PAP: Pap: NEGATIVE

## 2015-06-27 ENCOUNTER — Ambulatory Visit
Payer: Commercial Managed Care - HMO | Attending: Family Medicine | Admitting: Rehabilitative and Restorative Service Providers"

## 2016-04-17 ENCOUNTER — Encounter: Payer: Self-pay | Admitting: *Deleted

## 2018-04-17 DIAGNOSIS — M5489 Other dorsalgia: Secondary | ICD-10-CM | POA: Diagnosis not present

## 2018-06-10 DIAGNOSIS — N760 Acute vaginitis: Secondary | ICD-10-CM | POA: Diagnosis not present

## 2018-08-22 ENCOUNTER — Emergency Department (HOSPITAL_COMMUNITY)
Admission: EM | Admit: 2018-08-22 | Discharge: 2018-08-22 | Disposition: A | Discharge status: Home / Self Care | Payer: No Typology Code available for payment source | Attending: Emergency Medicine | Admitting: Emergency Medicine

## 2018-08-22 ENCOUNTER — Emergency Department (HOSPITAL_COMMUNITY): Payer: No Typology Code available for payment source

## 2018-08-22 ENCOUNTER — Encounter (HOSPITAL_COMMUNITY): Payer: Self-pay

## 2018-08-22 DIAGNOSIS — S0093XA Contusion of unspecified part of head, initial encounter: Secondary | ICD-10-CM | POA: Diagnosis not present

## 2018-08-22 DIAGNOSIS — Y990 Civilian activity done for income or pay: Secondary | ICD-10-CM | POA: Diagnosis not present

## 2018-08-22 DIAGNOSIS — S8992XA Unspecified injury of left lower leg, initial encounter: Secondary | ICD-10-CM | POA: Diagnosis present

## 2018-08-22 DIAGNOSIS — S161XXA Strain of muscle, fascia and tendon at neck level, initial encounter: Secondary | ICD-10-CM | POA: Diagnosis not present

## 2018-08-22 DIAGNOSIS — R51 Headache: Secondary | ICD-10-CM | POA: Insufficient documentation

## 2018-08-22 DIAGNOSIS — S40022A Contusion of left upper arm, initial encounter: Secondary | ICD-10-CM | POA: Insufficient documentation

## 2018-08-22 DIAGNOSIS — S8002XA Contusion of left knee, initial encounter: Secondary | ICD-10-CM | POA: Diagnosis not present

## 2018-08-22 DIAGNOSIS — Y9389 Activity, other specified: Secondary | ICD-10-CM | POA: Insufficient documentation

## 2018-08-22 DIAGNOSIS — Z9049 Acquired absence of other specified parts of digestive tract: Secondary | ICD-10-CM | POA: Diagnosis not present

## 2018-08-22 DIAGNOSIS — Y92149 Unspecified place in prison as the place of occurrence of the external cause: Secondary | ICD-10-CM | POA: Diagnosis not present

## 2018-08-22 DIAGNOSIS — T07XXXA Unspecified multiple injuries, initial encounter: Secondary | ICD-10-CM

## 2018-08-22 DIAGNOSIS — T148XXA Other injury of unspecified body region, initial encounter: Secondary | ICD-10-CM
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
## 2018-08-22 NOTE — Discharge Instructions (Addendum)
Take tylenol and ibuprofen as needed for pain. Apply ice as directed. Follow up with your doctor. Return here for worsening symptoms.

## 2018-08-22 NOTE — ED Notes (Signed)
Pt refused knee X-ray
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
## 2021-01-30 ENCOUNTER — Ambulatory Visit: Payer: Self-pay | Admitting: Internal Medicine
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
## 2021-04-10 ENCOUNTER — Ambulatory Visit: Payer: 59 | Admitting: Internal Medicine

## 2021-04-10 ENCOUNTER — Encounter: Payer: Self-pay | Admitting: Internal Medicine

## 2021-04-10 ENCOUNTER — Other Ambulatory Visit: Payer: Self-pay

## 2021-04-10 DIAGNOSIS — R011 Cardiac murmur, unspecified: Secondary | ICD-10-CM | POA: Diagnosis not present

## 2021-04-10 DIAGNOSIS — R002 Palpitations: Secondary | ICD-10-CM | POA: Diagnosis not present

## 2021-04-10 DIAGNOSIS — I491 Atrial premature depolarization: Secondary | ICD-10-CM

## 2021-04-10 DIAGNOSIS — R079 Chest pain, unspecified: Secondary | ICD-10-CM

## 2021-04-10 NOTE — Patient Instructions (Signed)
Medication Instructions:  Your physician recommends that you continue on your current medications as directed. Please refer to the Current Medication list given to you today.  *If you need a refill on your cardiac medications before your next appointment, please call your pharmacy*   Lab Work: NONE If you have labs (blood work) drawn today and your tests are completely normal, you will receive your results only by: MyChart Message (if you have MyChart) OR A paper copy in the mail If you have any lab test that is abnormal or we need to change your treatment, we will call you to review the results.   Testing/Procedures: Your physician has requested that you have an exercise tolerance test. For further information please visit [email or web address] Please also follow instruction sheet, as given.   Your physician has requested that you have an echocardiogram. Echocardiography is a painless test that uses sound waves to create images of your heart. It provides your doctor with information about the size and shape of your heart and how well your heart's chambers and valves are working. This procedure takes approximately one hour. There are no restrictions for this procedure.    Follow-Up: At [facility], you and your health needs are our priority.  As part of our continuing mission to provide you with exceptional heart care, we have created designated Provider Care Teams.  These Care Teams include your primary Cardiologist (physician) and Advanced Practice Providers (APPs -  Physician Assistants and Nurse Practitioners) who all work together to provide you with the care you need, when you need it.  We recommend signing up for the patient portal called "MyChart".  Sign up information is provided on this After Visit Summary.  MyChart is used to connect with patients for Virtual Visits (Telemedicine).  Patients are able to view lab/test results, encounter notes, upcoming appointments, etc.   Non-urgent messages can be sent to your provider as well.   To learn more about what you can do with MyChart, go to [email or web address].    Your next appointment:   3 month(s)  The format for your next appointment:   In Person  Provider:   You may see Christell Constant, MD or one of the following Advanced Practice Providers on your designated Care Team:   Ronie Spies, PA-C Jacolyn Reedy, PA-C

## 2021-05-02 ENCOUNTER — Ambulatory Visit (HOSPITAL_COMMUNITY): Payer: 59

## 2021-05-02 ENCOUNTER — Telehealth (HOSPITAL_COMMUNITY): Payer: Self-pay | Admitting: Internal Medicine

## 2021-05-02 NOTE — Telephone Encounter (Signed)
Will route this message to pts ordering Provider and RN, as a general FYI.

## 2021-05-02 NOTE — Telephone Encounter (Signed)
Patient called and cancelled echocardiogram for the reason below:   05/01/2021 4:31 PM [id number], [name]  Cancel Rsn: Patient (pt said not able to come in tomorrow and will cb to r/s)  Order will be removed from the active echo WQ and when pt calls back to reschedule we can reinstate the order. Thank you.

## 2021-05-13 ENCOUNTER — Telehealth (HOSPITAL_COMMUNITY): Payer: Self-pay | Admitting: Internal Medicine

## 2021-05-13 NOTE — Telephone Encounter (Signed)
Patient called and cancelled echocardiogram for reason below:   05/13/21 05/01/2021 4:31 PM [id number], [name]  Cancel Rsn: Patient (pt said not able to come in tomorrow and will cb to r/s)  Order will be removed from the echo WQ and when patient calls back to reschedule we will reinstate the order.   Thank you.

## 2021-05-28 ENCOUNTER — Encounter: Payer: Self-pay | Admitting: Internal Medicine

## 2021-05-28 ENCOUNTER — Encounter (HOSPITAL_COMMUNITY): Payer: Self-pay | Admitting: Internal Medicine

## 2021-06-11 ENCOUNTER — Telehealth (HOSPITAL_COMMUNITY): Payer: Self-pay | Admitting: Internal Medicine

## 2021-06-11 NOTE — Telephone Encounter (Signed)
Just an FYI. We have made several attempts to contact this patient including sending a letter to schedule or reschedule their echocardiogram. We will be removing the patient from the echo WQ.  05/28/21 MAILED LETTER [facility]  05/28/21 [facility] to schedule @ 10:19/[facility]  05-23-21 [facility] to call and schedule echo/gt same day  (2)  -----------------------------------------  05/13/21 05/01/2021 4:31 PM [id number], Laura Mejia  Cancel Rsn: Patient (pt said not able to come in tomorrow and will cb to r/Mejia)    05-13-21 [facility] to call and schedule echo/gxt sameday/saf  05/01/2021 4:31 PM [id number], Laura Mejia  Cancel Rsn: Patient (pt said not able to come in tomorrow and will cb to r/Mejia)        Thank you

## 2021-07-09 NOTE — Progress Notes (Deleted)
Cardiology Office Note:    Date:  07/09/2021   ID:  Laura Mejia, DOB 1985-10-15, MRN [id number]  PCP:  Patient, No Pcp Per (Inactive)   [facility] Providers Cardiologist:  Christell Constant, MD     Referring MD: No ref. provider found   CC: Chest pain follow up.  History of Present Illness:    Laura Mejia is a 35 y.o. female with a hx of anxiety, depression, palpitations, and a heart murmur NOS who presents for evaluation 04/10/21.   She had a history of HCM NOS in father who was getting evaluated.  She has not done any cardiac testing.  Seen 07/10/21.  Patient notes that she is doing ***.   Since day prior/last visit notes *** . There are no*** interval hospital/ED visit.    No chest pain or pressure ***.  No SOB/DOE*** and no PND/Orthopnea***.  No weight gain or leg swelling***.  No palpitations or syncope ***.  Ambulatory blood pressure ***.   Past Medical History:  Diagnosis Date   Anxiety    Current moderate episode of major depressive disorder without prior episode (HCC)    Heart murmur    Palpitations     Past Surgical History:  Procedure Laterality Date   CESAREAN SECTION     CHOLECYSTECTOMY      Current Medications: No outpatient medications have been marked as taking for the 07/10/21 encounter (Appointment) with Christell Constant, MD.     Allergies:   Patient has no known allergies.   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 Father had heart disease and a thick heart.  ROS:   Please see the history of present illness.     All other systems reviewed and are negative.  EKGs/Labs/Other Studies Reviewed:    The following studies were reviewed today:  EKG:  EKG is  ordered today.  The ekg ordered today demonstrates  04/10/21: SR rate 64 WNL  Cardiac Event Monitoring: Date: 10/01/2011 Results: - Rare PACs and NSVT (9-11 beats)   Transthoracic Echocardiogram: Date: 10/01/2011 Results: Report only - Left ventricle: The cavity size was normal. Wall thickness    was normal. Systolic function was normal. The estimated    ejection fraction was in the range of 60% to 65%. Wall    motion was normal; there were no regional wall motion    abnormalities. Left ventricular diastolic function    parameters were normal.  - Pulmonary arteries: Systolic pressure was mildly    increased. PA peak pressure: 20mm Hg (  S).     Recent Labs: No results found for requested labs within last 8760 hours.  Recent Lipid Panel No results found for: CHOL, TRIG, HDL, CHOLHDL, VLDL, LDLCALC, LDLDIRECT   Physical Exam:    VS:  There were no vitals taken for this visit.    Wt Readings from Last 3 Encounters:  04/10/21 183 lb (83 kg)  10/20/11 200 lb 12.8 oz (91.1 kg)  09/22/11 198 lb (89.8 kg)     GEN:  Well nourished, well developed in no acute distress HEENT: Normal NECK: No JVD LYMPHATICS: No lymphadenopathy CARDIAC: RRR, no murmurs, rubs, gallops RESPIRATORY:  Clear to auscultation without rales, wheezing or rhonchi  ABDOMEN: Soft, non-tender, non-distended MUSCULOSKELETAL:  No edema; No deformity   SKIN: Warm and dry NEUROLOGIC:  Alert and oriented x 3 PSYCHIATRIC:  Normal affect   ASSESSMENT:    No diagnosis found.  PLAN:    Chest pain Heart murmur- systolic Family history of thick heart NOS Palpitations; history of P-SVT asymptomatic presently PACs - will get POET - will get Echo - will obtain 14-day non live heart monitor (ZioPatch) if dizziness and palpitations get worse - patient will follow up after her father gets evaluated for his thick heart NOS -      Medication Adjustments/Labs and Tests Ordered: Current medicines are reviewed at length with the patient today.  Concerns regarding medicines are outlined above.  No orders of the defined types were placed in this encounter.   No orders of the defined types were placed in this encounter.   There are no Patient Instructions on file for this visit.   Signed, Christell Constant, MD  07/09/2021 12:20 PM    [facility]

## 2021-07-10 ENCOUNTER — Ambulatory Visit: Payer: 59 | Admitting: Internal Medicine

## 2022-11-11 ENCOUNTER — Other Ambulatory Visit: Payer: Self-pay

## 2022-11-11 ENCOUNTER — Emergency Department (HOSPITAL_BASED_OUTPATIENT_CLINIC_OR_DEPARTMENT_OTHER): Payer: 59

## 2022-11-11 ENCOUNTER — Emergency Department (HOSPITAL_BASED_OUTPATIENT_CLINIC_OR_DEPARTMENT_OTHER)
Admission: EM | Admit: 2022-11-11 | Discharge: 2022-11-11 | Disposition: A | Discharge status: Home / Self Care | Payer: 59 | Attending: Emergency Medicine | Admitting: Emergency Medicine

## 2022-11-11 ENCOUNTER — Encounter (HOSPITAL_BASED_OUTPATIENT_CLINIC_OR_DEPARTMENT_OTHER): Payer: Self-pay | Admitting: Emergency Medicine

## 2022-11-11 DIAGNOSIS — K5732 Diverticulitis of large intestine without perforation or abscess without bleeding: Secondary | ICD-10-CM | POA: Diagnosis not present

## 2022-11-11 DIAGNOSIS — R109 Unspecified abdominal pain: Secondary | ICD-10-CM | POA: Diagnosis present

## 2022-11-11 DIAGNOSIS — K5792 Diverticulitis of intestine, part unspecified, without perforation or abscess without bleeding: Secondary | ICD-10-CM

## 2022-11-11 LAB — CBC
HCT: 31.4 % — ABNORMAL LOW (ref 36.0–46.0)
Hemoglobin: 9.8 g/dL — ABNORMAL LOW (ref 12.0–15.0)
MCH: 22.6 pg — ABNORMAL LOW (ref 26.0–34.0)
MCHC: 31.2 g/dL (ref 30.0–36.0)
MCV: 72.4 fL — ABNORMAL LOW (ref 80.0–100.0)
Platelets: 425 10*3/uL — ABNORMAL HIGH (ref 150–400)
RBC: 4.34 MIL/uL (ref 3.87–5.11)
RDW: 16.3 % — ABNORMAL HIGH (ref 11.5–15.5)
WBC: 10 10*3/uL (ref 4.0–10.5)
nRBC: 0 % (ref 0.0–0.2)

## 2022-11-11 LAB — URINALYSIS, MICROSCOPIC (REFLEX)

## 2022-11-11 LAB — URINALYSIS, ROUTINE W REFLEX MICROSCOPIC
Bilirubin Urine: NEGATIVE
Glucose, UA: NEGATIVE mg/dL
Ketones, ur: NEGATIVE mg/dL
Leukocytes,Ua: NEGATIVE
Nitrite: NEGATIVE
Protein, ur: NEGATIVE mg/dL
Specific Gravity, Urine: 1.015 (ref 1.005–1.030)
pH: 6 (ref 5.0–8.0)

## 2022-11-11 LAB — PREGNANCY, URINE: Preg Test, Ur: NEGATIVE

## 2022-11-11 LAB — COMPREHENSIVE METABOLIC PANEL
ALT: 25 U/L (ref 0–44)
AST: 23 U/L (ref 15–41)
Albumin: 3.5 g/dL (ref 3.5–5.0)
Alkaline Phosphatase: 48 U/L (ref 38–126)
Anion gap: 8 (ref 5–15)
BUN: 14 mg/dL (ref 6–20)
CO2: 21 mmol/L — ABNORMAL LOW (ref 22–32)
Calcium: 8.6 mg/dL — ABNORMAL LOW (ref 8.9–10.3)
Chloride: 107 mmol/L (ref 98–111)
Creatinine, Ser: 0.89 mg/dL (ref 0.44–1.00)
GFR, Estimated: >60 mL/min (ref >60)
Glucose, Bld: 105 mg/dL — ABNORMAL HIGH (ref 70–99)
Potassium: 3.3 mmol/L — ABNORMAL LOW (ref 3.5–5.1)
Sodium: 136 mmol/L (ref 135–145)
Total Bilirubin: 0.4 mg/dL (ref 0.3–1.2)
Total Protein: 7.4 g/dL (ref 6.5–8.1)

## 2022-11-11 LAB — LIPASE, BLOOD: Lipase: 37 U/L (ref 11–51)

## 2022-11-11 MED ORDER — ONDANSETRON 4 MG PO TBDP: 4.0000 mg | ORAL_TABLET | Freq: Three times a day (TID) | ORAL | 0 refills | Status: DC | PRN

## 2022-11-11 MED ORDER — KETOROLAC TROMETHAMINE 15 MG/ML IJ SOLN
15.0000 mg | Freq: Once | INTRAMUSCULAR | Status: AC
  Administered 2022-11-11: 15 mg via INTRAVENOUS
  Filled 2022-11-11: qty 1

## 2022-11-11 MED ORDER — OXYCODONE-ACETAMINOPHEN 5-325 MG PO TABS
1.0000 | ORAL_TABLET | Freq: Once | ORAL | Status: AC
  Administered 2022-11-11: 1 via ORAL
  Filled 2022-11-11: qty 1

## 2022-11-11 MED ORDER — AMOXICILLIN-POT CLAVULANATE 875-125 MG PO TABS
1.0000 | ORAL_TABLET | Freq: Once | ORAL | Status: AC
  Administered 2022-11-11: 1 via ORAL
  Filled 2022-11-11: qty 1

## 2022-11-11 MED ORDER — AMOXICILLIN-POT CLAVULANATE 875-125 MG PO TABS: 1.0000 | ORAL_TABLET | Freq: Two times a day (BID) | ORAL | 0 refills | Status: DC

## 2022-11-11 NOTE — ED Triage Notes (Signed)
Severe abdominal pain since Thursday. Pain is worse more constant, patient is unable to sleep.

## 2022-11-11 NOTE — ED Provider Notes (Signed)
[place] EMERGENCY DEPARTMENT AT [facility] [place] Provider Note   CSN: [id number] Arrival date & time: 11/11/22  7829     History  No chief complaint on file.   Laura Mejia is a 37 y.o. female.  The history is provided by the patient and medical records.  Laura Mejia is a 37 y.o. female who presents to the Emergency Department complaining of abdominal pain.  She presents to the emergency department complaining of abdominal pain that started Thursday that is located in the lower pelvis.  She reports it is central and radiates to the right.  It is sharp and waxes and wanes but does have a constant component as well.  No fevers, nausea, vomiting.  She did initially have diarrhea-2 episodes.  Diarrhea is now resolved.  No dysuria, vaginal discharge.  No new sexual partners.  She has no known medical problems takes no medications.  She does have a history of prior cholecystectomy, cesarean section.  LMP was March 19.     Home Medications Prior to Admission medications   Medication Sig Start Date End Date Taking? Authorizing Provider  amoxicillin-clavulanate (AUGMENTIN) 875-125 MG tablet Take 1 tablet by mouth every 12 (twelve) hours. 11/11/22  Yes Tilden Fossa, MD  etonogestrel (NEXPLANON) 68 MG IMPL implant PROVIDED BY CARE CENTER 07/14/17  Yes [provider]  ondansetron (ZOFRAN-ODT) 4 MG disintegrating tablet Take 1 tablet (4 mg total) by mouth every 8 (eight) hours as needed for nausea or vomiting. 11/11/22  Yes Tilden Fossa, MD      Allergies    Patient has no known allergies.    Review of Systems   Review of Systems  All other systems reviewed and are negative.   Physical Exam Updated Vital Signs BP 126/70   Pulse 81   Temp 98.4 F (36.9 C) (Oral)   Resp 17   Ht  (1.575 m)   Wt 90.7 kg   LMP 10/14/2022 (Exact Date)   SpO2 100%   BMI 36.58 kg/m  Physical Exam Vitals and nursing note reviewed.  Constitutional:      Appearance:  She is well-developed.  HENT:     Head: Normocephalic and atraumatic.  Cardiovascular:     Rate and Rhythm: Normal rate and regular rhythm.  Pulmonary:     Effort: Pulmonary effort is normal. No respiratory distress.  Abdominal:     Palpations: Abdomen is soft.     Tenderness: There is abdominal tenderness. There is no guarding or rebound.     Comments: TTP over lower abdomen/suprapubic region  Musculoskeletal:        General: No tenderness.  Skin:    General: Skin is warm and dry.  Neurological:     Mental Status: She is alert and oriented to person, place, and time.  Psychiatric:        Behavior: Behavior normal.     ED Results / Procedures / Treatments   Labs (all labs ordered are listed, but only abnormal results are displayed) Labs Reviewed  COMPREHENSIVE METABOLIC PANEL - Abnormal; Notable for the following components:      Result Value   Potassium 3.3 (*)    CO2 21 (*)    Glucose, Bld 105 (*)    Calcium 8.6 (*)    All other components within normal limits  CBC - Abnormal; Notable for the following components:   Hemoglobin 9.8 (*)    HCT 31.4 (*)    MCV 72.4 (*)  MCH 22.6 (*)    RDW 16.3 (*)    Platelets 425 (*)    All other components within normal limits  URINALYSIS, ROUTINE W REFLEX MICROSCOPIC - Abnormal; Notable for the following components:   Hgb urine dipstick MODERATE (*)    All other components within normal limits  URINALYSIS, MICROSCOPIC (REFLEX) - Abnormal; Notable for the following components:   Bacteria, UA FEW (*)    All other components within normal limits  LIPASE, BLOOD  PREGNANCY, URINE    EKG None  Radiology CT Renal Stone Study  Result Date: 11/11/2022 CLINICAL DATA:  37 year old female with history of worsening abdominal pain for the past 6 days. EXAM: CT ABDOMEN AND PELVIS WITHOUT CONTRAST TECHNIQUE: Multidetector CT imaging of the abdomen and pelvis was performed following the standard protocol without IV contrast. RADIATION  DOSE REDUCTION: This exam was performed according to the departmental dose-optimization program which includes automated exposure control, adjustment of the mA and/or kV according to patient size and/or use of iterative reconstruction technique. COMPARISON:  No priors. FINDINGS: Lower chest: Unremarkable. Hepatobiliary: No suspicious cystic or solid hepatic lesions are confidently identified on today's noncontrast CT examination. Status post cholecystectomy. Pancreas: No definite pancreatic mass or peripancreatic fluid collections or inflammatory changes are noted on today's noncontrast CT examination. Spleen: Unremarkable. Adrenals/Urinary Tract: There are no abnormal calcifications within the collecting system of either kidney, along the course of either ureter, or within the lumen of the urinary bladder. No hydroureteronephrosis or perinephric stranding to suggest urinary tract obstruction at this time. The unenhanced appearance of the kidneys is unremarkable bilaterally. Unenhanced appearance of the urinary bladder is normal. Bilateral adrenal glands are normal in appearance. Stomach/Bowel: Unenhanced appearance of the stomach is normal. No pathologic dilatation of small bowel or colon. Numerous colonic diverticuli are noted. In the region of the distal descending colon and proximal sigmoid colon there are surrounding inflammatory changes in the associated mesocolon, concerning for an acute diverticulitis. Trace volume of fluid in this region is noted, which does not appear well organized (i.e., no definite discrete diverticular abscess). Normal appendix. Vascular/Lymphatic: No atherosclerotic calcifications are noted in the abdominal aorta or pelvic vasculature. No lymphadenopathy noted in the abdomen or pelvis. Reproductive: Uterus and right ovary are unremarkable in appearance. 2.4 cm low-attenuation lesion in the left adnexa (axial image 68 of series 2), likely a dominant follicle. Other: Trace volume of  reactive fluid adjacent to the inflamed diverticuli in the left lower quadrant. No larger volume of ascites. No pneumoperitoneum. Musculoskeletal: There are no aggressive appearing lytic or blastic lesions noted in the visualized portions of the skeleton. IMPRESSION: 1. Acute diverticulitis of the distal descending and proximal sigmoid colon, as above. There is a trace volume of non organized fluid in this region, presumably reactive. No diverticular abscess or signs of frank perforation are noted at this time. 2. Additional incidental imaging findings, as above. Electronically Signed   By: Trudie Reed M.D.   On: 11/11/2022 06:18    Procedures Procedures    Medications Ordered in ED Medications  ketorolac (TORADOL) 15 MG/ML injection 15 mg (15 mg Intravenous Given 11/11/22 0528)  oxyCODONE-acetaminophen (PERCOCET/ROXICET) 5-325 MG per tablet 1 tablet (1 tablet Oral Given 11/11/22 0643)  amoxicillin-clavulanate (AUGMENTIN) 875-125 MG per tablet 1 tablet (1 tablet Oral Given 11/11/22 1610)    ED Course/ Medical Decision Making/ A&P  Medical Decision Making Amount and/or Complexity of Data Reviewed Labs: ordered. Radiology: ordered.  Risk Prescription drug management.   Patient here for evaluation of lower abdominal pain since Thursday.  UA not consistent with UTI.  CBC with anemia-this is at her baseline per patient with reported baseline of 10.  CMP with mild hypokalemia, no additional significant electrolyte abnormality.  Given her pain a CT stone study was obtained, which is concerning for acute diverticulitis.  Images personally reviewed and evaluated.  Discussed with patient home care for diverticulitis with home pain management options of ibuprofen, acetaminophen, warm compresses.  Will prescribe antibiotics given severity of symptoms.  Discussed with patient importance of PCP/GI follow-up as well as return precautions for progressive or new concerning  symptoms.        Final Clinical Impression(s) / ED Diagnoses Final diagnoses:  Acute diverticulitis    Rx / DC Orders ED Discharge Orders          Ordered    amoxicillin-clavulanate (AUGMENTIN) 875-125 MG tablet  Every 12 hours        11/11/22 0636    ondansetron (ZOFRAN-ODT) 4 MG disintegrating tablet  Every 8 hours PRN        11/11/22 0636              Tilden Fossa, MD 11/11/22 [phone]

## 2022-11-13 ENCOUNTER — Encounter: Payer: Self-pay | Admitting: Physician Assistant

## 2022-11-17 NOTE — Progress Notes (Unsigned)
11/19/2022 Laura Mejia [id number] 1986-04-02  Referring provider: No ref. provider found Primary GI doctor: Dr. Chales Abrahams  ASSESSMENT AND PLAN:   Uncomplicated diverticulitis of colon First episode distal descending and proximal sigmoid Son with history of Crohn's, no family history of colon cancer Treated with 7 days of Augmentin, finished yesterday continues to have some abdominal pain and diarrhea this morning.  -Will treat with Cipro Flagyl, get CBC, c-Met, sed rate -No rebound on exam, though low threshold for repeating CT to rule out complications and ER precautions discussed with patient -Dicyclomine, heating pad, Tylenol for pain -Will schedule colonoscopy approximately 8 weeks out to evaluate for IBD, malignancy. We have discussed the risks of bleeding, infection, perforation, medication reactions, and remote risk of death associated with colonoscopy. All questions were answered and the patient acknowledges these risk and wishes to proceed.  Iron deficiency anemia, unspecified iron deficiency anemia type Patient's had a microcytic anemia for at least the past 11 years with baseline being 10-11.  Most recently ER Hgb 9.8, MCV 72.4 Patient's had Nexplanon for 10+ years and has sparse menstrual periods.  -Will go and schedule endoscopy with colonoscopy and check iron indices. -If patient has iron deficiency anemia we will continue with EGD and colon, patient does not have an iron deficiency likely this represents a thalassemia and we can cancel the endoscopy.    Patient Care Team: Patient, No Pcp Per as PCP - General (General Practice) Christell Constant, MD as PCP - Cardiology (Cardiology)  HISTORY OF PRESENT ILLNESS: 37 y.o. female with a past medical history of cholecystectomy 2008 and others listed below presents for evaluation of diverticultis and anemia.   11/11/2022 patient presented to [facility] with abdominal pain.  Waxing and waning right  central pelvic abdominal pain, no fevers chills nausea vomiting initially had diarrhea which resolved. CBC showed Hgb 9.8, MCV 72.4, baseline from 11-15 years ago was 10 and 11. Thrombocytosis at 425, no leukocytosis. Hypokalemia 3.3 otherwise normal kidney and liver, unremarkable urine negative lipase negative pregnancy.   CT abdomen pelvis without contrast to evaluate for hematuria and pelvic pain showed acute diverticulitis distal descending and proximal sigmoid no diverticular abscess or perforation.  Patient was given Augmentin and oxycodone.  Patient denies family history of colon cancer or other gastrointestinal malignancies that she knows of.  Her son was diagnosed with crohn's when he was 6-7. No other family history of autoimmune.  She has history of being anemic "forever" per patient.  She has nexplanon for the last 15 years, infrequent and not heavy menses.   She states she had fairly regular stools prior to this episode but could have episodes of diarrhea with what she ate.  Denies history of hematochezia or melena. Had dark brown stools.  Denies GERD, dysphagia, nausea, vomiting.  She states she was in pain for 1-2 weeks prior to the ER, she states the pain progressed so went to the ER on the 16th. She had diarrhea, nausea and vomiting x 1.   Diagnosed with diverticulitis and given Augmentin, only given 7 days, finished yesterday. Has had less pain, was feeling better until this AM.  She woke up with lower cramping and diarrhea.  She took ibuprofen with some help.  No fever, chills.   Denies rashes/joint pain.   She has 86 year old daughter and 14 year old son.  She denies blood thinner use.  She reports NSAID use daily for last 2 weeks for AB  pain. She reports ETOH use rare social drinking. She denies tobacco use.  She denies drug use.    She  reports that she has never smoked. She has never used smokeless tobacco. She reports current alcohol use. She reports that she does  not use drugs.  RELEVANT LABS AND IMAGING: CBC    Component Value Date/Time   WBC 10.0 11/11/2022 0400   RBC 4.34 11/11/2022 0400   HGB 9.8 (L) 11/11/2022 0400   HCT 31.4 (L) 11/11/2022 0400   PLT 425 (H) 11/11/2022 0400   MCV 72.4 (L) 11/11/2022 0400   MCH 22.6 (L) 11/11/2022 0400   MCHC 31.2 11/11/2022 0400   RDW 16.3 (H) 11/11/2022 0400   LYMPHSABS 3.4 09/09/2011 1545   MONOABS 0.6 09/09/2011 1545   EOSABS 0.2 09/09/2011 1545   BASOSABS 0.0 09/09/2011 1545   Recent Labs    11/11/22 0400  HGB 9.8*    CMP     Component Value Date/Time   NA 136 11/11/2022 0400   K 3.3 (L) 11/11/2022 0400   CL 107 11/11/2022 0400   CO2 21 (L) 11/11/2022 0400   GLUCOSE 105 (H) 11/11/2022 0400   BUN 14 11/11/2022 0400   CREATININE 0.89 11/11/2022 0400   CALCIUM 8.6 (L) 11/11/2022 0400   PROT 7.4 11/11/2022 0400   ALBUMIN 3.5 11/11/2022 0400   AST 23 11/11/2022 0400   ALT 25 11/11/2022 0400   ALKPHOS 48 11/11/2022 0400   BILITOT 0.4 11/11/2022 0400   GFRNONAA >60 11/11/2022 0400   GFRAA >90 09/09/2011 1545      Latest Ref Rng & Units 11/11/2022    4:00 AM 09/09/2011    3:45 PM 05/04/2007   12:21 PM  Hepatic Function  Total Protein 6.5 - 8.1 g/dL 7.4  7.9  7.2   Albumin 3.5 - 5.0 g/dL 3.5  3.9  4.1   AST 15 - 41 U/L ALT 0 - 44 U/L Alk Phosphatase 38 - 126 U/L 48  54  44   Total Bilirubin 0.3 - 1.2 mg/dL 0.4  0.2  0.5       Current Medications:   Current Outpatient Medications (Endocrine & Metabolic):    etonogestrel (NEXPLANON) 68 MG IMPL implant, PROVIDED BY [facility]ARE [facility]ENTER      Current Outpatient Medications (Other):    ciprofloxacin (CIPRO) 500 MG tablet, Take 1 tablet (500 mg total) by mouth 2 (two) times daily.   dicyclomine (BENTYL) 20 MG tablet, Take 1 tablet (20 mg total) by mouth every 8 (eight) hours as needed for spasms (AB pain).   metroNIDAZOLE (FLAGYL) 500 MG tablet, Take 1 tablet (500 mg total) by mouth 3 (three) times daily.    ondansetron (ZOFRAN-ODT) 4 MG disintegrating tablet, Take 1 tablet (4 mg total) by mouth every 8 (eight) hours as needed for nausea or vomiting.  Medical History:  Past Medical History:  Diagnosis Date   Anxiety    Current moderate episode of major depressive disorder without prior episode    Heart murmur    Palpitations    Allergies: No Known Allergies   Surgical History:  She  has a past surgical history that includes Cholecystectomy and Cesarean section. Family History:  Her family history includes Hypertension in her father.  REVIEW OF SYSTEMS  : All other systems reviewed and negative except where noted in the History of Present Illness.  PHYSICAL EXAM: BP 130/80   Pulse  81   Ht  (1.575 m)   Wt 200 lb 4 oz (90.8 kg)   LMP 10/14/2022 (Exact Date)   SpO2 100%   BMI 36.63 kg/m  General Appearance: Well nourished, in no apparent distress. Head:   Normocephalic and atraumatic. Eyes:  sclerae anicteric,conjunctive pink  Respiratory: Respiratory effort normal, BS equal bilaterally without rales, rhonchi, wheezing. Cardio: RRR with systolic murmur. Peripheral pulses intact.  Abdomen: Soft,  Obese ,active bowel sounds. Mild- moderate tenderness in the lower abdomen , right worse than leftWith guarding and Without rebound. No masses. Rectal: Not evaluated Musculoskeletal: Full ROM, Normal gait. Without edema. Skin:  Dry and intact without significant lesions or rashes Neuro: Alert and  oriented x4;  No focal deficits. Psych:  Cooperative. Normal mood and affect.    Doree Albee, PA-C 12:02 PM

## 2022-11-19 ENCOUNTER — Other Ambulatory Visit (INDEPENDENT_AMBULATORY_CARE_PROVIDER_SITE_OTHER): Payer: 59

## 2022-11-19 ENCOUNTER — Encounter: Payer: Self-pay | Admitting: Physician Assistant

## 2022-11-19 ENCOUNTER — Ambulatory Visit: Payer: 59 | Admitting: Physician Assistant

## 2022-11-19 VITALS — BP 130/80 | HR 81 | Ht 62.0 in | Wt 200.2 lb

## 2022-11-19 DIAGNOSIS — K5732 Diverticulitis of large intestine without perforation or abscess without bleeding: Secondary | ICD-10-CM

## 2022-11-19 DIAGNOSIS — D509 Iron deficiency anemia, unspecified: Secondary | ICD-10-CM | POA: Diagnosis not present

## 2022-11-19 LAB — CBC WITH DIFFERENTIAL/PLATELET
Basophils Absolute: 0.1 10*3/uL (ref 0.0–0.1)
Basophils Relative: 0.9 % (ref 0.0–3.0)
Eosinophils Absolute: 0.2 10*3/uL (ref 0.0–0.7)
Eosinophils Relative: 2.2 % (ref 0.0–5.0)
HCT: 31.4 % — ABNORMAL LOW (ref 36.0–46.0)
Hemoglobin: 10.2 g/dL — ABNORMAL LOW (ref 12.0–15.0)
Lymphocytes Relative: 32.4 % (ref 12.0–46.0)
Lymphs Abs: 2.4 10*3/uL (ref 0.7–4.0)
MCHC: 32.4 g/dL (ref 30.0–36.0)
MCV: 70.5 fl — ABNORMAL LOW (ref 78.0–100.0)
Monocytes Absolute: 0.5 10*3/uL (ref 0.1–1.0)
Monocytes Relative: 6.7 % (ref 3.0–12.0)
Neutro Abs: 4.2 10*3/uL (ref 1.4–7.7)
Neutrophils Relative %: 57.8 % (ref 43.0–77.0)
Platelets: 548 10*3/uL — ABNORMAL HIGH (ref 150.0–400.0)
RBC: 4.46 Mil/uL (ref 3.87–5.11)
RDW: 17 % — ABNORMAL HIGH (ref 11.5–15.5)
WBC: 7.3 10*3/uL (ref 4.0–10.5)

## 2022-11-19 LAB — SEDIMENTATION RATE: Sed Rate: 90 mm/hr — ABNORMAL HIGH (ref 0–20)

## 2022-11-19 LAB — COMPREHENSIVE METABOLIC PANEL
ALT: 21 U/L (ref 0–35)
AST: 17 U/L (ref 0–37)
Albumin: 3.8 g/dL (ref 3.5–5.2)
Alkaline Phosphatase: 42 U/L (ref 39–117)
BUN: 10 mg/dL (ref 6–23)
CO2: 27 mEq/L (ref 19–32)
Calcium: 9.1 mg/dL (ref 8.4–10.5)
Chloride: 105 mEq/L (ref 96–112)
Creatinine, Ser: 0.82 mg/dL (ref 0.40–1.20)
GFR: 91.07 mL/min (ref >60.00)
Glucose, Bld: 86 mg/dL (ref 70–99)
Potassium: 3.6 mEq/L (ref 3.5–5.1)
Sodium: 139 mEq/L (ref 135–145)
Total Bilirubin: 0.2 mg/dL (ref 0.2–1.2)
Total Protein: 7.3 g/dL (ref 6.0–8.3)

## 2022-11-19 LAB — IBC + FERRITIN
Ferritin: 23.3 ng/mL (ref 10.0–291.0)
Iron: 24 ug/dL — ABNORMAL LOW (ref 42–145)
Saturation Ratios: 6.5 % — ABNORMAL LOW (ref 20.0–50.0)
TIBC: 368.2 ug/dL (ref 250.0–450.0)
Transferrin: 263 mg/dL (ref 212.0–360.0)

## 2022-11-19 LAB — HIGH SENSITIVITY CRP: CRP, High Sensitivity: 16.71 mg/L — ABNORMAL HIGH (ref 0.000–5.000)

## 2022-11-19 MED ORDER — CIPROFLOXACIN HCL 500 MG PO TABS: 500.0000 mg | ORAL_TABLET | Freq: Two times a day (BID) | ORAL | 0 refills | Status: DC

## 2022-11-19 MED ORDER — METRONIDAZOLE 500 MG PO TABS
500.0000 mg | ORAL_TABLET | Freq: Three times a day (TID) | ORAL | 0 refills | Status: DC
Start: 2022-11-19 — End: 2023-04-15

## 2022-11-19 MED ORDER — DICYCLOMINE HCL 20 MG PO TABS: 20.0000 mg | ORAL_TABLET | Freq: Three times a day (TID) | ORAL | 0 refills | Status: DC | PRN

## 2022-11-19 NOTE — Patient Instructions (Addendum)
Your provider has requested that you go to the basement level for lab work before leaving today. Press "B" on the elevator. The lab is located at the first door on the left as you exit the elevator.   Will give Cipro and Flagyl  Add on fiber supplement like BENEFIBER 1-2 x a day Can take dicyclomine at least 1-2 x a day for pain if needed.   Can do heating pad and can take tylenol max of  a day.  Can add on lidocaine patches or voltern gel Go to the ER if unable to pass gas, severe AB pain, unable to hold down food, any shortness of breath of chest pain.   Diverticulitis Diverticulitis is inflammation or infection of small pouches in your colon that form when you have a condition called diverticulosis. The pouches in your colon are called diverticula. Your colon, or large intestine, is where water is absorbed and stool is formed. Complications of diverticulitis can include: Bleeding. Severe infection. Severe pain. Perforation of your colon. Obstruction of your colon.  What are the causes? Diverticulitis is caused by bacteria. Diverticulitis happens when stool becomes trapped in diverticula. This allows bacteria to grow in the diverticula, which can lead to inflammation and infection. What increases the risk? People with diverticulosis are at risk for diverticulitis. Eating a diet that does not include enough fiber from fruits and vegetables may make diverticulitis more likely to develop. What are the signs or symptoms? Symptoms of diverticulitis may include: Abdominal pain and tenderness. The pain is normally located on the left side of the abdomen, but may occur in other areas. Fever and chills. Bloating. Cramping. Nausea. Vomiting. Constipation. Diarrhea. Blood in your stool.  How is this diagnosed? Your health care provider will ask you about your medical history and do a physical exam. You may need to have tests done because many medical conditions can cause the same  symptoms as diverticulitis. Tests may include: Blood tests. Urine tests. Imaging tests of the abdomen, including X-rays and CT scans.  When your condition is under control, your health care provider may recommend that you have a colonoscopy. A colonoscopy can show how severe your diverticula are and whether something else is causing your symptoms. How is this treated? Most cases of diverticulitis are mild and can be treated at home. Treatment may include: Taking over-the-counter pain medicines. Following a clear liquid diet. Taking antibiotic medicines by mouth for 7-10 days.  More severe cases may be treated at a hospital. Treatment may include: Not eating or drinking. Taking prescription pain medicine. Receiving antibiotic medicines through an IV tube. Receiving fluids and nutrition through an IV tube. Surgery.  Follow these instructions at home: Follow your health care provider's instructions carefully. Follow a full liquid diet or other diet as directed by your health care provider. After your symptoms improve, your health care provider may tell you to change your diet. He or she may recommend you eat a high-fiber diet. Fruits and vegetables are good sources of fiber. Fiber makes it easier to pass stool. Take fiber supplements or probiotics as directed by your health care provider. Only take medicines as directed by your health care provider. Keep all your follow-up appointments. Contact a health care provider if: Your pain does not improve. You have a hard time eating food. Your bowel movements do not return to normal. Get help right away if: Your pain becomes worse. Your symptoms do not get better. Your symptoms suddenly get worse. You have a  fever. You have repeated vomiting. You have bloody or black, tarry stools. This information is not intended to replace advice given to you by your health care provider. Make sure you discuss any questions you have with your health care  provider. Document Released: 04/23/2005 Document Revised: 12/20/2015 Document Reviewed: 06/08/2013 Elsevier Interactive Patient Education  2017 [employer]vier Inc.  You have been scheduled for your colonoscopy and endoscopy with Dr Chales Abrahams, Your preop appointment is 01/02/2023 at 1 pm   I appreciate the  opportunity to care for you  Thank You   [facility]

## 2022-11-19 NOTE — Progress Notes (Signed)
Agree with assessment/plan.  Raj Medard Decuir, MD [facility] [phone]  

## 2023-01-09 ENCOUNTER — Ambulatory Visit: Payer: 59 | Admitting: Physician Assistant

## 2023-01-23 ENCOUNTER — Encounter: Payer: 59 | Admitting: Gastroenterology

## 2023-04-13 ENCOUNTER — Telehealth: Payer: Self-pay | Admitting: Physician Assistant

## 2023-04-13 ENCOUNTER — Other Ambulatory Visit: Payer: Self-pay

## 2023-04-13 NOTE — Telephone Encounter (Signed)
PT is calling to get script for pain and flagyl sent to [facility]. Please advise.

## 2023-04-15 ENCOUNTER — Other Ambulatory Visit: Payer: Self-pay

## 2023-04-15 ENCOUNTER — Other Ambulatory Visit (INDEPENDENT_AMBULATORY_CARE_PROVIDER_SITE_OTHER): Payer: 59

## 2023-04-15 DIAGNOSIS — R1084 Generalized abdominal pain: Secondary | ICD-10-CM

## 2023-04-15 LAB — CBC WITH DIFFERENTIAL/PLATELET
Basophils Absolute: 0.1 10*3/uL (ref 0.0–0.1)
Basophils Relative: 0.8 % (ref 0.0–3.0)
Eosinophils Absolute: 0.1 10*3/uL (ref 0.0–0.7)
Eosinophils Relative: 1.5 % (ref 0.0–5.0)
HCT: 36.3 % (ref 36.0–46.0)
Hemoglobin: 11 g/dL — ABNORMAL LOW (ref 12.0–15.0)
Lymphocytes Relative: 36.5 % (ref 12.0–46.0)
Lymphs Abs: 2.6 10*3/uL (ref 0.7–4.0)
MCHC: 30.2 g/dL (ref 30.0–36.0)
MCV: 70 fl — ABNORMAL LOW (ref 78.0–100.0)
Monocytes Absolute: 0.4 10*3/uL (ref 0.1–1.0)
Monocytes Relative: 5.4 % (ref 3.0–12.0)
Neutro Abs: 4 10*3/uL (ref 1.4–7.7)
Neutrophils Relative %: 55.8 % (ref 43.0–77.0)
Platelets: 475 10*3/uL — ABNORMAL HIGH (ref 150.0–400.0)
RBC: 5.19 Mil/uL — ABNORMAL HIGH (ref 3.87–5.11)
RDW: 19.9 % — ABNORMAL HIGH (ref 11.5–15.5)
WBC: 7.2 10*3/uL (ref 4.0–10.5)

## 2023-04-15 LAB — COMPREHENSIVE METABOLIC PANEL WITH GFR
ALT: 20 U/L (ref 0–35)
AST: 19 U/L (ref 0–37)
Albumin: 4.2 g/dL (ref 3.5–5.2)
Alkaline Phosphatase: 47 U/L (ref 39–117)
BUN: 8 mg/dL (ref 6–23)
CO2: 27 meq/L (ref 19–32)
Calcium: 9.2 mg/dL (ref 8.4–10.5)
Chloride: 106 meq/L (ref 96–112)
Creatinine, Ser: 0.97 mg/dL (ref 0.40–1.20)
GFR: 74.24 mL/min (ref >60.00)
Glucose, Bld: 104 mg/dL — ABNORMAL HIGH (ref 70–99)
Potassium: 3.5 meq/L (ref 3.5–5.1)
Sodium: 139 meq/L (ref 135–145)
Total Bilirubin: 0.4 mg/dL (ref 0.2–1.2)
Total Protein: 7.5 g/dL (ref 6.0–8.3)

## 2023-04-15 LAB — SEDIMENTATION RATE: Sed Rate: 77 mm/h — ABNORMAL HIGH (ref 0–20)

## 2023-04-15 MED ORDER — METRONIDAZOLE 500 MG PO TABS: 500.0000 mg | ORAL_TABLET | Freq: Three times a day (TID) | ORAL | 0 refills | Status: DC

## 2023-04-15 MED ORDER — DICYCLOMINE HCL 20 MG PO TABS: 20.0000 mg | ORAL_TABLET | Freq: Three times a day (TID) | ORAL | 0 refills | Status: DC | PRN

## 2023-04-16 ENCOUNTER — Other Ambulatory Visit: Payer: Self-pay

## 2023-04-16 MED ORDER — CIPROFLOXACIN HCL 500 MG PO TABS: 500.0000 mg | ORAL_TABLET | Freq: Two times a day (BID) | ORAL | 0 refills | Status: DC

## 2023-05-07 ENCOUNTER — Ambulatory Visit: Payer: 59 | Admitting: Physician Assistant

## 2023-05-12 NOTE — Progress Notes (Deleted)
  Cardiology Office Note    Patient Name: Laura Mejia Date of Encounter: 05/12/2023  Primary Care Provider:  Patient, No Pcp Per Primary Cardiologist:  Christell Constant, MD Primary Electrophysiologist: None   Past Medical History    Past Medical History:  Diagnosis Date   Anxiety    Current moderate episode of major depressive disorder without prior episode (HCC)    Heart murmur    Palpitations     History of Present Illness  Laura Mejia is a 37 y.o. female with a PMH of palpitations, depression, anxiety who presents today for follow-up of palpitations.  Laura Mejia was seen initially by Dr. Clifton James in 2013 with complaint of palpitations and event monitor that showed sinus rhythm and 2D echo that revealed normal EF of 60 to 65% with no valvular abnormalities and mildly elevated systolic pressure.  He was lost to follow-up until being seen by Dr. Raynelle Jan on 04/10/2021 when she reported sharp left-sided chest pain.  She was ordered a 14-day Zio patch along with echo and exercise treadmill test that were not completed and canceled by patient.  During today's visit the patient reports*** .  Patient denies chest pain, palpitations, dyspnea, PND, orthopnea, nausea, vomiting, dizziness, syncope, edema, weight gain, or early satiety.  ***Notes: -Last ischemic evaluation: -Last echo: -Interim ED visits: Review of Systems  Please see the history of present illness.    All other systems reviewed and are otherwise negative except as noted above.  Physical Exam    Wt Readings from Last 3 Encounters:  11/19/22 200 lb 4 oz (90.8 kg)  11/11/22 200 lb (90.7 kg)  04/10/21 183 lb (83 kg)   ZO:XWRUE were no vitals filed for this visit.,There is no height or weight on file to calculate BMI. GEN: Well nourished, well developed in no acute distress Neck: No JVD; No carotid bruits Pulmonary: Clear to auscultation without rales, wheezing or rhonchi  Cardiovascular: Normal  rate. Regular rhythm. Normal S1. Normal S2.   Murmurs: There is no murmur.  ABDOMEN: Soft, non-tender, non-distended EXTREMITIES:  No edema; No deformity   EKG/LABS/ Recent Cardiac Studies   ECG personally reviewed by me today - ***  Risk Assessment/Calculations:   {Does this patient have ATRIAL FIBRILLATION?:[phone]}      Lab Results  Component Value Date   WBC 7.2 04/15/2023   HGB 11.0 (L) 04/15/2023   HCT 36.3 04/15/2023   MCV 70.0 (L) 04/15/2023   PLT 475.0 (H) 04/15/2023   Lab Results  Component Value Date   CREATININE 0.97 04/15/2023   BUN 8 04/15/2023   NA 139 04/15/2023   K 3.5 04/15/2023   CL 106 04/15/2023   CO2 27 04/15/2023   No results found for: "CHOL", "HDL", "LDLCALC", "LDLDIRECT", "TRIG", "CHOLHDL"  No results found for: "HGBA1C" Assessment & Plan    1.  Chest pain  2.  Palpitations  3.  Anemia  4.***      Disposition: Follow-up with Christell Constant, MD or APP in *** months {Are you ordering a CV Procedure (e.g. stress test, cath, DCCV, TEE, etc)?   Press F2        :[id number]}   Signed, Napoleon Form, Leodis Rains, NP 05/12/2023, 5:04 PM [facility]

## 2023-05-14 ENCOUNTER — Ambulatory Visit: Payer: 59 | Attending: Nurse Practitioner | Admitting: Nurse Practitioner

## 2023-05-14 DIAGNOSIS — R002 Palpitations: Secondary | ICD-10-CM

## 2023-05-14 DIAGNOSIS — R079 Chest pain, unspecified: Secondary | ICD-10-CM

## 2023-05-14 DIAGNOSIS — D509 Iron deficiency anemia, unspecified: Secondary | ICD-10-CM

## 2023-05-14 NOTE — Progress Notes (Signed)
05/15/2023 Octaviano Glow [id number] 01/21/1986  Referring provider: No ref. provider found Primary GI doctor: Dr. Chales Abrahams  ASSESSMENT AND PLAN:   Uncomplicated diverticulitis of colon Family history of IBD in her son No pain at this time, last treated a month ago with elevated ESR, treated with cipro/flagyl -Will repeat CBC, c-Met, sed rate -Dicyclomine, heating pad, Tylenol for pain - add on fiber/miralax  Iron deficiency anemia, unspecified iron deficiency anemia type Patient's had a microcytic anemia for at least the past 11 years with baseline being 10-11.   Had recent prolong period despite nexplanon, has now been taken out She is not on iron, will check this visit and add on if it is low.  Can do IV iron if not able to tolerate.   I recommend upper gastrointestinal and colorectal evaluation with an EGD and colonoscopy.  Risk of bowel prep, conscious sedation, and EGD and colonoscopy were discussed.  Risks include but are not limited to dehydration, pain, bleeding, cardiopulmonary process, bowel perforation, or other possible adverse outcomes..  Treatment plan was discussed with patient, and agreed upon.    Patient Care Team: Patient, No Pcp Per as PCP - General (General Practice) Christell Constant, MD as PCP - Cardiology (Cardiology)  HISTORY OF PRESENT ILLNESS: 37 y.o. female with a past medical history of cholecystectomy 2008 and others listed below presents for evaluation of diverticultis and anemia.   11/11/2022 patient presented to [facility] with abdominal pain.  Waxing and waning right central pelvic abdominal pain, no fevers chills nausea vomiting initially had diarrhea which resolved. CBC showed Hgb 9.8, MCV 72.4, baseline from 11-15 years ago was 10 and 11. Thrombocytosis at 425, no leukocytosis. Hypokalemia 3.3 otherwise normal kidney and liver, unremarkable urine negative lipase negative pregnancy.   CT abdomen pelvis without contrast  to evaluate for hematuria and pelvic pain showed acute diverticulitis distal descending and proximal sigmoid no diverticular abscess or perforation.  Patient was given Augmentin and oxycodone. 11/19/2022 patient seen by myself in the office after diverticulitis and anemia.  Treated with Cipro Flagyl will schedule for colonoscopy and endoscopy to rule out IBD/malignancy have this was not done. She had IDA at the time, possibly menses related but set up for EGD as well. She had nexplanon removed recently due to prolonged periods, she is not on iron.  She states since that time she has been doing well until last month she started AB pain and constipation, called.  Given cipro/flagyl on 09/18 with bentyl and scheduled this appointment. She only took 5 day but states she felt better.  BM after the antibiotics.  Denies history of hematochezia or melena.  Denies GERD, dysphagia, nausea, vomiting.  No fever, chills.   Denies rashes, she does have left anterior hip pain, no lower back pain or joint pain. She has had some hair loss.   Patient denies family history of colon cancer or other gastrointestinal malignancies that she knows of.  Her son was diagnosed with crohn's when he was 6-7. No other family history of autoimmune.  She has history of being anemic "forever" per patient.  She has nexplanon for the last 15 years, infrequent and not heavy menses.   She has 55 year old daughter and 83 year old son.  She denies blood thinner use.  She reports NSAID use daily for last 2 weeks for AB pain. She reports ETOH use rare social drinking. She denies tobacco use.  She denies drug use.  She  reports that she has never smoked. She has never used smokeless tobacco. She reports current alcohol use. She reports that she does not use drugs.  RELEVANT LABS AND IMAGING: CBC    Component Value Date/Time   WBC 7.2 04/15/2023 1242   RBC 5.19 (H) 04/15/2023 1242   HGB 11.0 (L) 04/15/2023 1242   HCT 36.3  04/15/2023 1242   PLT 475.0 (H) 04/15/2023 1242   MCV 70.0 (L) 04/15/2023 1242   MCH 22.6 (L) 11/11/2022 0400   MCHC 30.2 04/15/2023 1242   RDW 19.9 (H) 04/15/2023 1242   LYMPHSABS 2.6 04/15/2023 1242   MONOABS 0.4 04/15/2023 1242   EOSABS 0.1 04/15/2023 1242   BASOSABS 0.1 04/15/2023 1242   Recent Labs    11/11/22 0400 11/19/22 1222 04/15/23 1242  HGB 9.8* 10.2* 11.0*    CMP     Component Value Date/Time   NA 139 04/15/2023 1242   K 3.5 04/15/2023 1242   CL 106 04/15/2023 1242   CO2 27 04/15/2023 1242   GLUCOSE 104 (H) 04/15/2023 1242   BUN 8 04/15/2023 1242   CREATININE 0.97 04/15/2023 1242   CALCIUM 9.2 04/15/2023 1242   PROT 7.5 04/15/2023 1242   ALBUMIN 4.2 04/15/2023 1242   AST 19 04/15/2023 1242   ALT 20 04/15/2023 1242   ALKPHOS 47 04/15/2023 1242   BILITOT 0.4 04/15/2023 1242   GFRNONAA >60 11/11/2022 0400   GFRAA >90 09/09/2011 1545      Latest Ref Rng & Units 04/15/2023   12:42 PM 11/19/2022   12:22 PM 11/11/2022    4:00 AM  Hepatic Function  Total Protein 6.0 - 8.3 g/dL 7.5  7.3  7.4   Albumin 3.5 - 5.2 g/dL 4.2  3.8  3.5   AST 0 - 37 U/L 19  17  23    ALT 0 - 35 U/L 20  21  25    Alk Phosphatase 39 - 117 U/L 47  42  48   Total Bilirubin 0.2 - 1.2 mg/dL 0.4  0.2  0.4       Current Medications:        Current Outpatient Medications (Other):    ciprofloxacin (CIPRO) 500 MG tablet, Take 1 tablet (500 mg total) by mouth 2 (two) times daily.   dicyclomine (BENTYL) 20 MG tablet, Take 1 tablet (20 mg total) by mouth every 8 (eight) hours as needed for spasms (AB pain).   metroNIDAZOLE (FLAGYL) 500 MG tablet, Take 1 tablet (500 mg total) by mouth 3 (three) times daily.   ondansetron (ZOFRAN-ODT) 4 MG disintegrating tablet, Take 1 tablet (4 mg total) by mouth every 8 (eight) hours as needed for nausea or vomiting.  Medical History:  Past Medical History:  Diagnosis Date   Anxiety    Current moderate episode of major depressive disorder without  prior episode (HCC)    Heart murmur    Palpitations    Allergies: No Known Allergies   Surgical History:  She  has a past surgical history that includes Cholecystectomy and Cesarean section. Family History:  Her family history includes Hypertension in her father.  REVIEW OF SYSTEMS  : All other systems reviewed and negative except where noted in the History of Present Illness.  PHYSICAL EXAM: BP 120/76   Pulse 71   Ht 5\' 2"  (1.575 m)   Wt 205 lb 2 oz (93 kg)   BMI 37.52 kg/m  General Appearance: Well nourished, in no apparent distress. Head:   Normocephalic and  atraumatic. Eyes:  sclerae anicteric,conjunctive pink  Respiratory: Respiratory effort normal, BS equal bilaterally without rales, rhonchi, wheezing. Cardio: RRR with systolic murmur. Peripheral pulses intact.  Abdomen: Soft,  Obese ,active bowel sounds. Mild tenderness, With guarding and Without rebound. No masses. Rectal: Not evaluated Musculoskeletal: Full ROM, Normal gait. Without edema. Skin:  Dry and intact without significant lesions or rashes Neuro: Alert and  oriented x4;  No focal deficits. Psych:  Cooperative. Normal mood and affect.    Doree Albee, PA-C 11:15 AM

## 2023-05-15 ENCOUNTER — Ambulatory Visit: Payer: 59 | Admitting: Physician Assistant

## 2023-05-15 ENCOUNTER — Encounter: Payer: Self-pay | Admitting: Physician Assistant

## 2023-05-15 VITALS — BP 120/76 | HR 71 | Ht 62.0 in | Wt 205.1 lb

## 2023-05-15 DIAGNOSIS — K5732 Diverticulitis of large intestine without perforation or abscess without bleeding: Secondary | ICD-10-CM

## 2023-05-15 DIAGNOSIS — R1084 Generalized abdominal pain: Secondary | ICD-10-CM | POA: Diagnosis not present

## 2023-05-15 DIAGNOSIS — D509 Iron deficiency anemia, unspecified: Secondary | ICD-10-CM | POA: Diagnosis not present

## 2023-05-15 MED ORDER — CLENPIQ 10-3.5-12 MG-GM -GM/160ML PO SOLN: 1.0000 | ORAL | 0 refills | Status: DC

## 2023-05-15 NOTE — Patient Instructions (Addendum)
Your provider has requested that you go to the basement level for lab work before leaving today. Press "B" on the elevator. The lab is located at the first door on the left as you exit the elevator.   Miralax is an osmotic laxative.  It only brings more water into the stool.  This is safe to take daily.  Can take up to 17 gram of miralax twice a day.  Mix with juice or coffee.  Start 1 capful at night for 3-4 days and reassess your response in 3-4 days.  You can increase and decrease the dose based on your response.  Remember, it can take up to 3-4 days to take effect OR for the effects to wear off.   I often pair this with benefiber in the morning to help assure the stool is not too loose.   You have been scheduled for an endoscopy and colonoscopy. Please follow the written instructions given to you at your visit today.  Please pick up your prep supplies at the pharmacy within the next 1-3 days.  If you use inhalers (even only as needed), please bring them with you on the day of your procedure.  DO NOT TAKE 7 DAYS PRIOR TO TEST- Trulicity (dulaglutide) Ozempic, Wegovy (semaglutide) Mounjaro (tirzepatide) Bydureon Bcise (exanatide extended release)  DO NOT TAKE 1 DAY PRIOR TO YOUR TEST Rybelsus (semaglutide) Adlyxin (lixisenatide) Victoza (liraglutide) Byetta (exanatide) ___________________________________________________________________________   Diverticulosis Diverticulosis is a condition that develops when small pouches (diverticula) form in the wall of the large intestine (colon). The colon is where water is absorbed and stool (feces) is formed. The pouches form when the inside layer of the colon pushes through weak spots in the outer layers of the colon. You may have a few pouches or many of them. The pouches usually do not cause problems unless they become inflamed or infected. When this happens, the condition is called diverticulitis- this is left lower quadrant pain,  diarrhea, fever, chills, nausea or vomiting.  If this occurs please call the office or go to the hospital. Sometimes these patches without inflammation can also have painless bleeding associated with them, if this happens please call the office or go to the hospital. Preventing constipation and increasing fiber can help reduce diverticula and prevent complications. Even if you feel you have a high-fiber diet, suggest getting on Benefiber or Cirtracel 2 times daily.  325 mg iron once or twice a day, with orange juice or vitamin C to absorb better.  Increase food rich in iron.  Can cause constipation, can add on miralax or the fiber.  If you can not tolerate it, let us know and we can get you an IV iron.

## 2023-05-21 NOTE — Progress Notes (Signed)
Agree with assessment/plan.  Raj Florestine Carmical, MD [facility] [phone]  

## 2023-05-25 ENCOUNTER — Other Ambulatory Visit: Payer: Self-pay | Admitting: Physician Assistant

## 2023-05-28 ENCOUNTER — Other Ambulatory Visit: Payer: Self-pay

## 2023-05-28 MED ORDER — NA SULFATE-K SULFATE-MG SULF 17.5-3.13-1.6 GM/177ML PO SOLN: ORAL | 0 refills | Status: DC

## 2023-07-06 ENCOUNTER — Encounter: Payer: Self-pay | Admitting: Gastroenterology

## 2023-07-06 ENCOUNTER — Ambulatory Visit (AMBULATORY_SURGERY_CENTER): Payer: 59 | Admitting: Gastroenterology

## 2023-07-06 VITALS — BP 129/81 | HR 85 | Temp 98.1°F | Resp 16 | Ht 62.0 in | Wt 205.0 lb

## 2023-07-06 DIAGNOSIS — D509 Iron deficiency anemia, unspecified: Secondary | ICD-10-CM | POA: Diagnosis not present

## 2023-07-06 DIAGNOSIS — K5730 Diverticulosis of large intestine without perforation or abscess without bleeding: Secondary | ICD-10-CM | POA: Diagnosis present

## 2023-07-06 DIAGNOSIS — K640 First degree hemorrhoids: Secondary | ICD-10-CM

## 2023-07-06 DIAGNOSIS — K319 Disease of stomach and duodenum, unspecified: Secondary | ICD-10-CM | POA: Diagnosis not present

## 2023-07-06 DIAGNOSIS — K5732 Diverticulitis of large intestine without perforation or abscess without bleeding: Secondary | ICD-10-CM

## 2023-07-06 DIAGNOSIS — R1084 Generalized abdominal pain: Secondary | ICD-10-CM

## 2023-07-06 MED ORDER — SODIUM CHLORIDE 0.9 % IV SOLN
500.0000 mL | Freq: Once | INTRAVENOUS | Status: DC
Start: 2023-07-06 — End: 2023-07-06

## 2023-07-06 MED ORDER — OMEPRAZOLE 20 MG PO CPDR: 20.0000 mg | DELAYED_RELEASE_CAPSULE | Freq: Every day | ORAL | 0 refills | Status: DC

## 2023-07-06 NOTE — Progress Notes (Signed)
Sedate, gd SR, tolerated procedure well, VSS, report to RN 

## 2023-07-06 NOTE — Patient Instructions (Signed)
YOU HAD AN ENDOSCOPIC PROCEDURE TODAY AT THE  ENDOSCOPY CENTER:   Refer to the procedure report that was given to you for any specific questions about what was found during the examination.  If the procedure report does not answer your questions, please call your gastroenterologist to clarify.  If you requested that your care partner not be given the details of your procedure findings, then the procedure report has been included in a sealed envelope for you to review at your convenience later.  Thank you for letting us take care of your healthcare needs today. Please see handouts given to you on Diverticulosis, Hemorrhoids and Gastritis. New Rx for Omeprazole sent to your Pharmacy to help with the inflammation in your stomach. (Gastritis) Continue the High Fiber Diet. Use Benefiber 1 TBS daily with 8 ounces of water if you have hard stools.    YOU SHOULD EXPECT: Some feelings of bloating in the abdomen. Passage of more gas than usual.  Walking can help get rid of the air that was put into your GI tract during the procedure and reduce the bloating. If you had a lower endoscopy (such as a colonoscopy or flexible sigmoidoscopy) you may notice spotting of blood in your stool or on the toilet paper. If you underwent a bowel prep for your procedure, you may not have a normal bowel movement for a few days.  Please Note:  You might notice some irritation and congestion in your nose or some drainage.  This is from the oxygen used during your procedure.  There is no need for concern and it should clear up in a day or so.  SYMPTOMS TO REPORT IMMEDIATELY:  Following lower endoscopy (colonoscopy or flexible sigmoidoscopy):  Excessive amounts of blood in the stool  Significant tenderness or worsening of abdominal pains  Swelling of the abdomen that is new, acute  Fever of 100F or higher  Following upper endoscopy (EGD)  Vomiting of blood or coffee ground material  New chest pain or pain under the  shoulder blades  Painful or persistently difficult swallowing  New shortness of breath  Fever of 100F or higher  Black, tarry-looking stools  For urgent or emergent issues, a gastroenterologist can be reached at any hour by calling [phone]. Do not use MyChart messaging for urgent concerns.    DIET:  We do recommend a small meal at first, but then you may proceed to your regular diet.  Drink plenty of fluids but you should avoid alcoholic beverages for 24 hours.  ACTIVITY:  You should plan to take it easy for the rest of today and you should NOT DRIVE or use heavy machinery until tomorrow (because of the sedation medicines used during the test).    FOLLOW UP: Our staff will call the number listed on your records the next business day following your procedure.  We will call around 7:15- 8:00 am to check on you and address any questions or concerns that you may have regarding the information given to you following your procedure. If we do not reach you, we will leave a message.     If any biopsies were taken you will be contacted by phone or by letter within the next 1-3 weeks.  Please call us at [phone] if you have not heard about the biopsies in 3 weeks.    SIGNATURES/CONFIDENTIALITY: You and/or your care partner have signed paperwork which will be entered into your electronic medical record.  These signatures attest to the fact  that that the information above on your After Visit Summary has been reviewed and is understood.  Full responsibility of the confidentiality of this discharge information lies with you and/or your care-partner.

## 2023-07-06 NOTE — Progress Notes (Signed)
Pt's states no medical or surgical changes since previsit or office visit. 

## 2023-07-06 NOTE — Progress Notes (Signed)
Called to room to assist during endoscopic procedure.  Patient ID and intended procedure confirmed with present staff. Received instructions for my participation in the procedure from the performing physician.  

## 2023-07-06 NOTE — Progress Notes (Signed)
05/15/2023 Laura Mejia [id number] 09-27-1985   Referring provider: No ref. provider found Primary GI doctor: Dr. Chales Abrahams   ASSESSMENT AND PLAN:    Uncomplicated diverticulitis of colon Family history of IBD in her son No pain at this time, last treated a month ago with elevated ESR, treated with cipro/flagyl -Will repeat CBC, c-Met, sed rate -Dicyclomine, heating pad, Tylenol for pain - add on fiber/miralax   Iron deficiency anemia, unspecified iron deficiency anemia type Patient's had a microcytic anemia for at least the past 11 years with baseline being 10-11.   Had recent prolong period despite nexplanon, has now been taken out She is not on iron, will check this visit and add on if it is low.  Can do IV iron if not able to tolerate.    I recommend upper gastrointestinal and colorectal evaluation with an EGD and colonoscopy.  Risk of bowel prep, conscious sedation, and EGD and colonoscopy were discussed.  Risks include but are not limited to dehydration, pain, bleeding, cardiopulmonary process, bowel perforation, or other possible adverse outcomes..  Treatment plan was discussed with patient, and agreed upon.       Patient Care Team: Patient, No Pcp Per as PCP - General (General Practice) Christell Constant, MD as PCP - Cardiology (Cardiology)   HISTORY OF PRESENT ILLNESS: 37 y.o. female with a past medical history of cholecystectomy 2008 and others listed below presents for evaluation of diverticultis and anemia.    11/11/2022 patient presented to [facility] with abdominal pain.  Waxing and waning right central pelvic abdominal pain, no fevers chills nausea vomiting initially had diarrhea which resolved. CBC showed Hgb 9.8, MCV 72.4, baseline from 11-15 years ago was 10 and 11. Thrombocytosis at 425, no leukocytosis. Hypokalemia 3.3 otherwise normal kidney and liver, unremarkable urine negative lipase negative pregnancy.   CT abdomen pelvis without  contrast to evaluate for hematuria and pelvic pain showed acute diverticulitis distal descending and proximal sigmoid no diverticular abscess or perforation.  Patient was given Augmentin and oxycodone. 11/19/2022 patient seen by myself in the office after diverticulitis and anemia.  Treated with Cipro Flagyl will schedule for colonoscopy and endoscopy to rule out IBD/malignancy have this was not done. She had IDA at the time, possibly menses related but set up for EGD as well. She had nexplanon removed recently due to prolonged periods, she is not on iron.  She states since that time she has been doing well until last month she started AB pain and constipation, called.  Given cipro/flagyl on 09/18 with bentyl and scheduled this appointment. She only took 5 day but states she felt better.  BM after the antibiotics.  Denies history of hematochezia or melena.  Denies GERD, dysphagia, nausea, vomiting.  No fever, chills.   Denies rashes, she does have left anterior hip pain, no lower back pain or joint pain. She has had some hair loss.    Patient denies family history of colon cancer or other gastrointestinal malignancies that she knows of.  Her son was diagnosed with crohn's when he was 6-7. No other family history of autoimmune.  She has history of being anemic "forever" per patient.  She has nexplanon for the last 15 years, infrequent and not heavy menses.   She has 62 year old daughter and 24 year old son.  She denies blood thinner use.  She reports NSAID use daily for last 2 weeks for AB pain. She reports ETOH use rare social drinking.  She denies tobacco use.  She denies drug use.     She  reports that she has never smoked. She has never used smokeless tobacco. She reports current alcohol use. She reports that she does not use drugs.   RELEVANT LABS AND IMAGING: CBC Labs (Brief)          Component Value Date/Time    WBC 7.2 04/15/2023 1242    RBC 5.19 (H) 04/15/2023 1242    HGB 11.0  (L) 04/15/2023 1242    HCT 36.3 04/15/2023 1242    PLT 475.0 (H) 04/15/2023 1242    MCV 70.0 (L) 04/15/2023 1242    MCH 22.6 (L) 11/11/2022 0400    MCHC 30.2 04/15/2023 1242    RDW 19.9 (H) 04/15/2023 1242    LYMPHSABS 2.6 04/15/2023 1242    MONOABS 0.4 04/15/2023 1242    EOSABS 0.1 04/15/2023 1242    BASOSABS 0.1 04/15/2023 1242      Recent Labs (within last 365 days)       Recent Labs    11/11/22 0400 11/19/22 1222 04/15/23 1242  HGB 9.8* 10.2* 11.0*        CMP     Labs (Brief)          Component Value Date/Time    NA 139 04/15/2023 1242    K 3.5 04/15/2023 1242    CL 106 04/15/2023 1242    CO2 27 04/15/2023 1242    GLUCOSE 104 (H) 04/15/2023 1242    BUN 8 04/15/2023 1242    CREATININE 0.97 04/15/2023 1242    CALCIUM 9.2 04/15/2023 1242    PROT 7.5 04/15/2023 1242    ALBUMIN 4.2 04/15/2023 1242    AST 19 04/15/2023 1242    ALT 20 04/15/2023 1242    ALKPHOS 47 04/15/2023 1242    BILITOT 0.4 04/15/2023 1242    GFRNONAA >60 11/11/2022 0400    GFRAA >90 09/09/2011 1545          Latest Ref Rng & Units 04/15/2023   12:42 PM 11/19/2022   12:22 PM 11/11/2022    4:00 AM  Hepatic Function  Total Protein 6.0 - 8.3 g/dL 7.5  7.3  7.4   Albumin 3.5 - 5.2 g/dL 4.2  3.8  3.5   AST 0 - 37 U/L 19  17  23    ALT 0 - 35 U/L 20  21  25    Alk Phosphatase 39 - 117 U/L 47  42  48   Total Bilirubin 0.2 - 1.2 mg/dL 0.4  0.2  0.4       Current Medications:              Current Outpatient Medications (Other):    ciprofloxacin (CIPRO) 500 MG tablet, Take 1 tablet (500 mg total) by mouth 2 (two) times daily.   dicyclomine (BENTYL) 20 MG tablet, Take 1 tablet (20 mg total) by mouth every 8 (eight) hours as needed for spasms (AB pain).   metroNIDAZOLE (FLAGYL) 500 MG tablet, Take 1 tablet (500 mg total) by mouth 3 (three) times daily.   ondansetron (ZOFRAN-ODT) 4 MG disintegrating tablet, Take 1 tablet (4 mg total) by mouth every 8 (eight) hours as needed for nausea or  vomiting.   Medical History:      Past Medical History:  Diagnosis Date   Anxiety     Current moderate episode of major depressive disorder without prior episode (HCC)     Heart murmur     Palpitations  Allergies:  Allergies  No Known Allergies      Surgical History:  She  has a past surgical history that includes Cholecystectomy and Cesarean section. Family History:  Her family history includes Hypertension in her father.   REVIEW OF SYSTEMS  : All other systems reviewed and negative except where noted in the History of Present Illness.   PHYSICAL EXAM: BP 120/76   Pulse 71   Ht 5\' 2"  (1.575 m)   Wt 205 lb 2 oz (93 kg)   BMI 37.52 kg/m  General Appearance: Well nourished, in no apparent distress. Head:   Normocephalic and atraumatic. Eyes:  sclerae anicteric,conjunctive pink  Respiratory: Respiratory effort normal, BS equal bilaterally without rales, rhonchi, wheezing. Cardio: RRR with systolic murmur. Peripheral pulses intact.  Abdomen: Soft,  Obese ,active bowel sounds. Mild tenderness, With guarding and Without rebound. No masses. Rectal: Not evaluated Musculoskeletal: Full ROM, Normal gait. Without edema. Skin:  Dry and intact without significant lesions or rashes Neuro: Alert and  oriented x4;  No focal deficits. Psych:  Cooperative. Normal mood and affect.      Doree Albee, PA-C    Attending physician's note   I have taken history, reviewed the chart and examined the patient. I performed a substantive portion of this encounter, including complete performance of at least one of the key components, in conjunction with the APP. I agree with the Advanced Practitioner's note, impression and recommendations.   EGD/colon today   Edman Circle, MD [facility] [phone]

## 2023-07-06 NOTE — Op Note (Signed)
[facility] Patient Name: Laura Mejia Procedure Date: 07/06/2023 7:32 AM MRN: [id number] Endoscopist: Lynann Bologna , MD, [id number] Age: 37 Referring MD:  Date of Birth: 1986/06/27 Gender: Female Account #: [id number] Procedure:                Upper GI endoscopy Indications:              Iron deficiency anemia. Medicines:                Monitored Anesthesia Care Procedure:                Pre-Anesthesia Assessment:                           - Prior to the procedure, a History and Physical                            was performed, and patient medications and                            allergies were reviewed. The patient's tolerance of                            previous anesthesia was also reviewed. The risks                            and benefits of the procedure and the sedation                            options and risks were discussed with the patient.                            All questions were answered, and informed consent                            was obtained. Prior Anticoagulants: The patient has                            taken no anticoagulant or antiplatelet agents. ASA                            Grade Assessment: II - A patient with mild systemic                            disease. After reviewing the risks and benefits,                            the patient was deemed in satisfactory condition to                            undergo the procedure.                           After obtaining informed consent, the endoscope was  passed under direct vision. Throughout the                            procedure, the patient's blood pressure, pulse, and                            oxygen saturations were monitored continuously. The                            GIF [id number] #[id number] was introduced through the                            mouth, and advanced to the second part of duodenum.                            The upper GI endoscopy was  accomplished without                            difficulty. The patient tolerated the procedure                            well. Scope In: Scope Out: Findings:                 The examined esophagus was normal.                           The Z-line was regular and was found 35 cm from the                            incisors.                           Localized mild inflammation characterized by                            erythema and granularity was found in the gastric                            antrum. Biopsies were taken with a cold forceps for                            histology.                           The examined duodenum was normal. Biopsies for                            histology were taken with a cold forceps for                            evaluation of celiac disease. Complications:            No immediate complications. Estimated Blood Loss:     Estimated blood loss: none. Impression:               -  Mild gastritis. Recommendation:           - Patient has a contact number available for                            emergencies. The signs and symptoms of potential                            delayed complications were discussed with the                            patient. Return to normal activities tomorrow.                            Written discharge instructions were provided to the                            patient.                           - Resume previous diet.                           - Continue present medications.                           - Omeprazole 20 mg p.o. daily x 4 weeks, then stop.                           - Await pathology results.                           - The findings and recommendations were discussed                            with the patient's family. Lynann Bologna, MD 07/06/2023 9:01:57 AM This report has been signed electronically.

## 2023-07-06 NOTE — Op Note (Signed)
[facility] Patient Name: Laura Mejia Procedure Date: 07/06/2023 7:33 AM MRN: [id number] Endoscopist: Lynann Bologna , MD, [id number] Age: 37 Referring MD:  Date of Birth: 11-26-1985 Gender: Female Account #: [id number] Procedure:                Colonoscopy Indications:              Iron deficiency anemia. H/O diverticulitis 10/2022                            treated with Cipro/Flagyl.- Medicines:                Monitored Anesthesia Care Procedure:                Pre-Anesthesia Assessment:                           - Prior to the procedure, a History and Physical                            was performed, and patient medications and                            allergies were reviewed. The patient's tolerance of                            previous anesthesia was also reviewed. The risks                            and benefits of the procedure and the sedation                            options and risks were discussed with the patient.                            All questions were answered, and informed consent                            was obtained. Prior Anticoagulants: The patient has                            taken no anticoagulant or antiplatelet agents. ASA                            Grade Assessment: II - A patient with mild systemic                            disease. After reviewing the risks and benefits,                            the patient was deemed in satisfactory condition to                            undergo the procedure.  After obtaining informed consent, the colonoscope                            was passed under direct vision. Throughout the                            procedure, the patient's blood pressure, pulse, and                            oxygen saturations were monitored continuously. The                            CF [id number] #[id number] was introduced through the anus                            and advanced to the 2 cm into  the ileum. The                            colonoscopy was performed without difficulty. The                            patient tolerated the procedure well. The quality                            of the bowel preparation was good. The terminal                            ileum, ileocecal valve, appendiceal orifice, and                            rectum were photographed. Scope In: 8:46:29 AM Scope Out: 8:59:43 AM Scope Withdrawal Time: 0 hours 9 minutes 5 seconds  Total Procedure Duration: 0 hours 13 minutes 14 seconds  Findings:                 A few small-mouthed diverticula were found in the                            sigmoid colon.                           Non-bleeding internal hemorrhoids were found during                            retroflexion. The hemorrhoids were Grade I                            (internal hemorrhoids that do not prolapse).                           The terminal ileum appeared normal.                           The exam was otherwise without abnormality on  direct and retroflexion views. Complications:            No immediate complications. Estimated Blood Loss:     Estimated blood loss: none. Impression:               - Mild sigmoid diverticulosis.                           - Non-bleeding internal hemorrhoids.                           - The examined portion of the ileum was normal.                           - The examination was otherwise normal on direct                            and retroflexion views.                           - No specimens collected. Recommendation:           - Patient has a contact number available for                            emergencies. The signs and symptoms of potential                            delayed complications were discussed with the                            patient. Return to normal activities tomorrow.                            Written discharge instructions were provided to the                             patient.                           - Resume previous high-fiber diet. If she gets                            constipated or has hard stools, start Benefiber 1                            tablespoon p.o. daily with 8 ounces of water.                           - Continue present medications.                           - Repeat colonoscopy in 10 years for screening                            purposes. Earlier, if with any new problems or  change in family history.                           - If continues to have microcytic anemia, consider                            hematology consultation.                           - The findings and recommendations were discussed                            with the patient's family. Lynann Bologna, MD 07/06/2023 9:05:26 AM This report has been signed electronically.

## 2023-07-07 ENCOUNTER — Telehealth: Payer: Self-pay

## 2023-07-07 NOTE — Telephone Encounter (Signed)
  Follow up Call-     07/06/2023    8:02 AM  Call back number  Post procedure Call Back phone  # [phone]  Permission to leave phone message Yes    Attempted to call patient regarding follow-up, no answer. Left VM.

## 2023-07-08 LAB — SURGICAL PATHOLOGY

## 2023-07-12 ENCOUNTER — Encounter: Payer: Self-pay | Admitting: Gastroenterology

## 2023-07-29 ENCOUNTER — Other Ambulatory Visit: Payer: Self-pay | Admitting: Gastroenterology

## 2023-09-08 ENCOUNTER — Encounter (INDEPENDENT_AMBULATORY_CARE_PROVIDER_SITE_OTHER): Payer: 59 | Admitting: Physician Assistant

## 2023-09-09 ENCOUNTER — Encounter (INDEPENDENT_AMBULATORY_CARE_PROVIDER_SITE_OTHER): Payer: 59 | Admitting: Family Medicine

## 2023-09-09 ENCOUNTER — Encounter (INDEPENDENT_AMBULATORY_CARE_PROVIDER_SITE_OTHER): Payer: Self-pay

## 2023-10-13 ENCOUNTER — Encounter (INDEPENDENT_AMBULATORY_CARE_PROVIDER_SITE_OTHER): Payer: Self-pay | Admitting: Physician Assistant

## 2023-10-13 ENCOUNTER — Ambulatory Visit (INDEPENDENT_AMBULATORY_CARE_PROVIDER_SITE_OTHER): Payer: 59 | Admitting: Physician Assistant

## 2023-10-13 VITALS — BP 136/84 | HR 91 | Temp 99.2°F | Ht 61.0 in | Wt 200.0 lb

## 2023-10-13 DIAGNOSIS — I491 Atrial premature depolarization: Secondary | ICD-10-CM

## 2023-10-13 DIAGNOSIS — K5790 Diverticulosis of intestine, part unspecified, without perforation or abscess without bleeding: Secondary | ICD-10-CM

## 2023-10-13 DIAGNOSIS — D509 Iron deficiency anemia, unspecified: Secondary | ICD-10-CM

## 2023-10-13 DIAGNOSIS — E66812 Obesity, class 2: Secondary | ICD-10-CM

## 2023-10-13 DIAGNOSIS — Z0289 Encounter for other administrative examinations: Secondary | ICD-10-CM

## 2023-10-13 DIAGNOSIS — R011 Cardiac murmur, unspecified: Secondary | ICD-10-CM | POA: Diagnosis not present

## 2023-10-13 DIAGNOSIS — E6609 Other obesity due to excess calories: Secondary | ICD-10-CM

## 2023-10-13 DIAGNOSIS — R002 Palpitations: Secondary | ICD-10-CM | POA: Diagnosis not present

## 2023-10-13 DIAGNOSIS — Z6837 Body mass index (BMI) 37.0-37.9, adult: Secondary | ICD-10-CM

## 2023-10-13 NOTE — Progress Notes (Signed)
 Office: [phone]  /  Fax: [phone]   Initial Visit  Laura Mejia was seen in clinic today to evaluate for obesity. She is interested in losing weight to improve overall health and reduce the risk of weight related complications. She presents today to review program treatment options, initial physical assessment, and evaluation.     She was referred by: Self-Referral  When asked what else they would like to accomplish? She states: Adopt healthier eating patterns, Improve energy levels and physical activity, Improve quality of life, Improve appearance, Improve self-confidence, and Lose a target amount of weight : loose 50-60  lbs in 9-12 months.  When asked how has your weight affected you? She states: Has affected self-esteem, Relationships, Having fatigue, Having poor endurance, Problems with eating patterns, and Has affected mood   Weight history: Struggled with being mildly overweight as teenager.    Then gained more weight after pregnancies.   Used Nexplanon for birth control and felt that caused weight gain and then changed to sedentary job which caused further weight gain.   Some associated conditions: Hyperlipidemia, GERD, Tachyarrhythmia, Vitamin D Deficiency, and Other: Diverticulitis in recent past  Contributing factors: Family history of obesity, Consumption of processed foods, Moderate to high levels of stress, Reduced physical activity, Eating patterns, Slow metabolism for age, and Enticing relationships and enviroment  Weight promoting medications identified: Contraceptives or hormonal therapy  Current nutrition plan: None and Portion control / smart choices  Current level of physical activity: None  Current or previous pharmacotherapy: None  Response to medication: Never tried medications   Past medical history includes:   Past Medical History:  Diagnosis Date   Anxiety    Current moderate episode of major depressive disorder without prior episode  (HCC)    Heart murmur    Palpitations      Objective:   BP 136/84   Pulse 91   Temp 99.2 F (37.3 C)   Ht 5\' 1"  (1.549 m)   Wt 200 lb (90.7 kg)   LMP 10/06/2023   SpO2 100%   BMI 37.79 kg/m  She was weighed on the bioimpedance scale: Body mass index is 37.79 kg/m.  Peak Weight:208 lbs , Body Fat%:44.6%, Visceral Fat Rating:11, Weight trend over the last 12 months: Unchanged- Up and down but always plateau's about 200 lbs  General:  Alert, oriented and cooperative. Patient is in no acute distress.  Respiratory: Normal respiratory effort, no problems with respiration noted   Gait: able to ambulate independently  Mental Status: Normal mood and affect. Normal behavior. Normal judgment and thought content.   DIAGNOSTIC DATA REVIEWED:  BMET    Component Value Date/Time   NA 139 04/15/2023 1242   K 3.5 04/15/2023 1242   CL 106 04/15/2023 1242   CO2 27 04/15/2023 1242   GLUCOSE 104 (H) 04/15/2023 1242   BUN 8 04/15/2023 1242   CREATININE 0.97 04/15/2023 1242   CALCIUM 9.2 04/15/2023 1242   GFRNONAA >60 11/11/2022 0400   GFRAA >90 09/09/2011 1545   No results found for: "HGBA1C" No results found for: "INSULIN" CBC    Component Value Date/Time   WBC 7.2 04/15/2023 1242   RBC 5.19 (H) 04/15/2023 1242   HGB 11.0 (L) 04/15/2023 1242   HCT 36.3 04/15/2023 1242   PLT 475.0 (H) 04/15/2023 1242   MCV 70.0 (L) 04/15/2023 1242   MCH 22.6 (L) 11/11/2022 0400   MCHC 30.2 04/15/2023 1242   RDW 19.9 (H) 04/15/2023 1242   Iron/TIBC/Ferritin/ %Sat  Component Value Date/Time   IRON 24 (L) 11/19/2022 1222   TIBC 368.2 11/19/2022 1222   FERRITIN 23.3 11/19/2022 1222   IRONPCTSAT 6.5 (L) 11/19/2022 1222   Lipid Panel  No results found for: "CHOL", "TRIG", "HDL", "CHOLHDL", "VLDL", "LDLCALC", "LDLDIRECT" Hepatic Function Panel     Component Value Date/Time   PROT 7.5 04/15/2023 1242   ALBUMIN 4.2 04/15/2023 1242   AST 19 04/15/2023 1242   ALT 20 04/15/2023 1242   ALKPHOS  47 04/15/2023 1242   BILITOT 0.4 04/15/2023 1242   No results found for: "TSH"   Assessment and Plan:   Palpitations  Murmur, cardiac  PAC (premature atrial contraction)  Iron deficiency anemia, unspecified iron deficiency anemia type  Diverticular disease  Class 2 obesity due to excess calories without serious comorbidity with body mass index (BMI) of 37.0 to 37.9 in adult   Current BMI 37.8      Obesity Treatment / Action Plan:  Patient will work on garnering support from family and friends to begin weight loss journey. Will work on eliminating or reducing the presence of highly palatable, calorie dense foods in the home. Will complete provided nutritional and psychosocial assessment questionnaire before the next appointment. Will be scheduled for indirect calorimetry to determine resting energy expenditure in a fasting state.  This will allow [place] to create a reduced calorie, high-protein meal plan to promote loss of fat mass while preserving muscle mass. Will think about ideas on how to incorporate physical activity into their daily routine. Will avoid skipping meals which may result in increased hunger signals and overeating at certain times. Will work on reading labels, making healthier choices and watching portion sizes. Counseled on the health benefits of losing 5%-15% of total body weight. Will work on improving sleep hygiene and trying to obtain at least 7 hours of sleep. Was counseled on nutritional approaches to weight loss and benefits of reducing processed foods and consuming plant-based foods and high quality protein as part of nutritional weight management. Was counseled on pharmacotherapy and role as an adjunct in weight management.   Obesity Education Performed Today:  She was weighed on the bioimpedance scale and results were discussed and documented in the synopsis.  We discussed obesity as a disease and the importance of a more detailed evaluation of all  the factors contributing to the disease.  We discussed the importance of long term lifestyle changes which include nutrition, exercise and behavioral modifications as well as the importance of customizing this to her specific health and social needs.  We discussed the benefits of reaching a healthier weight to alleviate the symptoms of existing conditions and reduce the risks of the biomechanical, metabolic and psychological effects of obesity.  Octaviano Glow appears to be in the action stage of change and states they are ready to start intensive lifestyle modifications and behavioral modifications.  30 minutes was spent today on this visit including the above counseling, pre-visit chart review, and post-visit documentation.  Reviewed by clinician on day of visit: allergies, medications, problem list, medical history, surgical history, family history, social history, and previous encounter notes pertinent to obesity diagnosis.   Charlita Brian,PA-C

## 2023-11-11 ENCOUNTER — Telehealth: Payer: Self-pay | Admitting: Gastroenterology

## 2023-11-11 NOTE — Telephone Encounter (Signed)
 Inbound call from patient stating she dropped off FMLA paperwork about a week ago to be filled out by Dr. Venice Gillis. States she has not heard anything. Requesting a call back. Please advise, thank you.

## 2023-11-11 NOTE — Telephone Encounter (Signed)
 Laura Mejia said that this patient came 10-21-23 and it was given to you regarding her FLMA. Please advise

## 2023-11-12 NOTE — Telephone Encounter (Signed)
 Laura Mejia said she laid it on your desk. Did you give it to the nurse? She said she will call the patient too

## 2023-11-12 NOTE — Telephone Encounter (Signed)
 Mardelle Shady said that her company emailed the patient and the email said paperwork was received and patient doesn't know what paperwork it is. I told Mardelle Shady to call the patient back and tell her she needs to find out what paperwork was received then. I have checked your in and out box and I didn't see anything

## 2023-11-16 ENCOUNTER — Encounter (INDEPENDENT_AMBULATORY_CARE_PROVIDER_SITE_OTHER): Payer: Self-pay | Admitting: Internal Medicine

## 2023-11-16 ENCOUNTER — Ambulatory Visit (INDEPENDENT_AMBULATORY_CARE_PROVIDER_SITE_OTHER): Admitting: Internal Medicine

## 2023-11-16 VITALS — BP 165/95 | HR 80 | Temp 98.1°F | Ht 61.0 in | Wt 203.0 lb

## 2023-11-16 DIAGNOSIS — Z6838 Body mass index (BMI) 38.0-38.9, adult: Secondary | ICD-10-CM | POA: Diagnosis not present

## 2023-11-16 DIAGNOSIS — R29818 Other symptoms and signs involving the nervous system: Secondary | ICD-10-CM

## 2023-11-16 DIAGNOSIS — R0602 Shortness of breath: Secondary | ICD-10-CM

## 2023-11-16 DIAGNOSIS — I10 Essential (primary) hypertension: Secondary | ICD-10-CM | POA: Insufficient documentation

## 2023-11-16 DIAGNOSIS — E66812 Obesity, class 2: Secondary | ICD-10-CM | POA: Diagnosis not present

## 2023-11-16 DIAGNOSIS — Z1331 Encounter for screening for depression: Secondary | ICD-10-CM

## 2023-11-16 DIAGNOSIS — R5383 Other fatigue: Secondary | ICD-10-CM

## 2023-11-16 DIAGNOSIS — E6609 Other obesity due to excess calories: Secondary | ICD-10-CM | POA: Insufficient documentation

## 2023-11-16 MED ORDER — AMLODIPINE BESYLATE 2.5 MG PO TABS: 2.5000 mg | ORAL_TABLET | Freq: Every day | ORAL | 0 refills | Status: DC

## 2023-11-16 NOTE — Assessment & Plan Note (Addendum)
 Repeat blood pressure 170/100 she is at prior elevations in the past.  She does not have a primary care doctor has been using mostly walk-in clinics in [place].  She has a blood pressure machine at home and was educated on how to properly measure her blood pressure today.  She will start monitoring in the morning and also before bedtime.  Will going to start her on amlodipine  2.5 mg starting today she may increase medication to 5 mg over the next 48 to 72 hours if her blood pressure is still above 140/90.  Patient advised against energy drinks she had been drinking Celsius.  Also to avoid taking NSAIDs and maintain a diet low in sodium.  She will schedule an appointment at [facility] to establish continuity and for management of her blood pressure.  Please refer to orders

## 2023-11-16 NOTE — Assessment & Plan Note (Signed)
 Contributing factors: Limited food variety, chronic skipping of meals low volume of physical activity, high levels of stress, suspected sleep apnea.  See obesity treatment plan

## 2023-11-16 NOTE — Assessment & Plan Note (Addendum)
 Laura Mejia admits to daytime somnolence and admits to waking up still tired. Patient has a history of symptoms of daytime fatigue, morning fatigue, and morning headache. Laura Mejia generally gets 6 hours of sleep per night, and states that she has poor sleep quality. Snoring is present. Apneic episodes are not present. Epworth Sleepiness Score is 13.  Also has elevated blood pressure.  Patient will be referred for polysomnography at the next office visit

## 2023-11-16 NOTE — Progress Notes (Signed)
 [address] [place],  [place], [place] [address]  Office: [phone]  /  Fax: [phone]   Subjective   Initial Visit  Laura Mejia (MR# [id number]) is a 38 y.o. female who presents for evaluation and treatment of obesity and related comorbidities. Current BMI is Body mass index is 38.36 kg/m. Laura Mejia has been struggling with her weight for many years and has been unsuccessful in either losing weight, maintaining weight loss, or reaching her healthy weight goal.  [name] is currently in the action stage of change and ready to dedicate time achieving and maintaining a healthier weight. Laura Mejia is interested in becoming our patient and working on intensive lifestyle modifications including (but not limited to) diet and exercise for weight loss.  She was referred by: Self-Referral   When asked what else they would like to accomplish? She states: Adopt healthier eating patterns, Improve energy levels and physical activity, Improve quality of life, Improve appearance, Improve self-confidence, and Lose a target amount of weight : loose 50-60  lbs in 9-12 months.   When asked how has your weight affected you? She states: Has affected self-esteem, Relationships, Having fatigue, Having poor endurance, Problems with eating patterns, and Has affected mood     Weight history:  Weight history: Struggled with being mildly overweight as teenager.                                    Then gained more weight after pregnancies.                         Used Nexplanon for birth control and felt that caused weight gain and then changed to sedentary job which caused further weight gain.    Some associated conditions: Hyperlipidemia, GERD, Tachyarrhythmia, Vitamin D  Deficiency, and Other: Diverticulitis in recent past   Contributing factors: Family history of obesity, Consumption of processed foods, Moderate to high levels of stress, Reduced physical activity, Eating patterns, Slow metabolism for age, and  Enticing relationships and enviroment   Weight promoting medications identified: Contraceptives or hormonal therapy   Current nutrition plan: None and Portion control / smart choices   Current level of physical activity: None   Current or previous pharmacotherapy: None   Response to medication: Never tried medications  Their highest weight has been:  208 lbs.  Desired weight: 150  Previous weight-loss programs : Balanced Plate / Portion Control.  Their maximum weight loss was:  NA lbs.  Their greatest challenge with dieting:  time and work .  Current or previous pharmacotherapy: None.  Response to medication: Never tried medications   Nutritional History:  Current nutrition plan: Portion control / smart choices.  How many times do you eat outside the home: > 7 per week  How often do they skip meals: skips 1-2 meals a day  What beverages do they drink: regular soda , juice, sweet tea , and alcohol, type: margaritas  Use of artificial sweetners : No  Food intolerances or dislikes: none.  Food triggers: Stress, Boredom, To help comfort self, and When Sad.  Food cravings: Starches / Carbohydrates and Fast Food  Do they struggle with excessive hunger or portion control : Yes    Physical Activity:  Current level of physical activity: Low levels of physical activity at present and Other: some walking  Barriers to Exercise:  weather   Past medical history includes:  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)    Depression    Diverticulitis    Gallbladder problem    GERD (gastroesophageal reflux disease)    Heart murmur    Palpitations    Vitamin D  deficiency      Objective   BP (!) 165/95   Pulse 80   Temp 98.1 F (36.7 C)   Ht 5\' 1"  (1.549 m)   Wt 203 lb (92.1 kg)   LMP 11/06/2023   SpO2 100%   BMI 38.36 kg/m   She was weighed on the bioimpedance scale: Body mass index is 38.36 kg/m.    Anthropometrics:  Vitals Temp: 98.1 F (36.7 C) BP: (!) 165/95 Pulse Rate: 80 SpO2: 100 %   Anthropometric Measurements Height: 5\' 1"  (1.549 m) Weight: 203 lb (92.1 kg) BMI (Calculated): 38.38 Starting Weight: 203 lb Peak Weight: 208 lb Waist Measurement : 41 inches   Body Composition  Body Fat %: 46.1 % Fat Mass (lbs): 93.6 lbs Muscle Mass (lbs): 103.8 lbs Total Body Water (lbs): 79.2 lbs Visceral Fat Rating : 12   Other Clinical Data Fasting: yes Labs: yes Today's Visit #: 1 Starting Date: 11/16/23    Physical Exam:  General: She is overweight, cooperative, alert, well developed, and in no acute distress. PSYCH: Has normal mood, affect and thought process.   HEENT: EOMI, sclerae are anicteric. Lungs: Normal breathing effort, no conversational dyspnea. Extremities: No edema.  Neurologic: No gross sensory or motor deficits. No tremors or fasciculations noted.    Diagnostic Data Reviewed  EKG: Normal sinus rhythm, rate 68.  She has nonspecific T wave abnormalities that been present on prior EKGs  Indirect Calorimeter completed today shows a VO2 of 250 and a REE of 1728.  Her calculated basal metabolic rate is 0865 thus her resting energy expenditure faster than calculated.  Depression Screen  Saachi's PHQ-9 score was: 18.      No data to display          Screening for Sleep Related Breathing Disorders  Niyanna admits to daytime somnolence and admits to waking up still tired. Patient has a history of symptoms of daytime fatigue, morning fatigue, and morning headache. Artrice generally gets 6 hours of sleep per night, and states that she has poor sleep quality. Snoring is present. Apneic episodes are not present. Epworth Sleepiness Score is 13.   BMET    Component Value Date/Time   NA 139 04/15/2023 1242   K 3.5 04/15/2023 1242   CL 106 04/15/2023 1242   CO2 27  04/15/2023 1242   GLUCOSE 104 (H) 04/15/2023 1242   BUN 8 04/15/2023 1242   CREATININE 0.97 04/15/2023 1242   CALCIUM 9.2 04/15/2023 1242   GFRNONAA >60 11/11/2022 0400   GFRAA >90 09/09/2011 1545   No results found for: "HGBA1C" No results found for: "INSULIN " CBC    Component Value Date/Time   WBC 7.2 04/15/2023 1242   RBC 5.19 (H) 04/15/2023 1242   HGB 11.0 (L) 04/15/2023 1242   HCT 36.3 04/15/2023 1242   PLT 475.0 (H) 04/15/2023 1242   MCV 70.0 (L) 04/15/2023 1242   MCH 22.6 (L) 11/11/2022 0400   MCHC 30.2 04/15/2023 1242   RDW 19.9 (H) 04/15/2023 1242   Iron/TIBC/Ferritin/ %Sat    Component Value Date/Time   IRON  24 (L) 11/19/2022 1222   TIBC 368.2 11/19/2022 1222   FERRITIN 23.3 11/19/2022 1222   IRONPCTSAT 6.5 (L) 11/19/2022 1222   Lipid Panel  No results found for: "CHOL", "TRIG", "HDL", "CHOLHDL", "VLDL", "LDLCALC", "LDLDIRECT" Hepatic Function Panel     Component Value Date/Time   PROT 7.5 04/15/2023 1242   ALBUMIN 4.2 04/15/2023 1242   AST 19 04/15/2023 1242   ALT 20 04/15/2023 1242   ALKPHOS 47 04/15/2023 1242   BILITOT 0.4 04/15/2023 1242   No results found for: "TSH"   Assessment and Plan   TREATMENT PLAN FOR OBESITY:  Recommended Dietary Goals  Stephanieann is currently in the action stage of change. As such, her goal is to implement medically supervised weight loss plan.  She has agreed to implement: the Category 2 plan - 1200 kcal per day  Behavioral Intervention  We discussed the following Behavioral Modification Strategies today: increasing lean protein intake to established goals, decreasing simple carbohydrates , increasing vegetables, increasing lower glycemic fruits, increasing fiber rich foods, avoiding skipping meals, increasing water intake, work on meal planning and preparation, reading food labels , keeping healthy foods at home, identifying sources and decreasing liquid calories, decreasing eating out or consumption of processed foods,  and making healthy choices when eating convenient foods, planning for success, and better snacking choices  Additional resources provided today: Handout on healthy eating and balanced plate, Handout on complex carbohydrates and lean sources of protein, Category 2 packet, and handout principles of weight management  Recommended Physical Activity Goals  Chanell has been advised to work up to 150 minutes of moderate intensity aerobic activity a week and strengthening exercises 2-3 times per week for cardiovascular health, weight loss maintenance and preservation of muscle mass.   She has agreed to :  Think about enjoyable ways to increase daily physical activity and overcoming barriers to exercise and Increase physical activity in their day and reduce sedentary time (increase NEAT).  Pharmacotherapy We will work on building a Therapist, art and behavioral strategies. We will discuss the role of pharmacotherapy as an adjunct at subsequent visits.   ASSOCIATED CONDITIONS ADDRESSED TODAY  Other Fatigue Analaura admits to daytime somnolence and admits to waking up still tired. Patient has a history of symptoms of daytime fatigue, morning fatigue, and morning headache. Lilybelle generally gets 6 hours of sleep per night, and states that she has poor sleep quality. Snoring is present. Apneic episodes are not present. Epworth Sleepiness Score is 13. Chris Countryman does feel that her weight is causing her energy to be lower than it should be. Fatigue may be related to obesity, depression or many other causes. Labs will be ordered, and in the meanwhile, Stevey will focus on self care including making healthy food choices, increasing physical activity and focusing on stress reduction.  Shortness of Breath Bindu notes increasing shortness of breath with exercising and seems to be worsening over time with weight gain. She notes getting out of breath sooner with activity than she used to. This  has not gotten worse recently. Emarie denies shortness of breath at rest or orthopnea.Levonne notes increasing shortness of breath with exercising and seems to be worsening over time with weight gain. She notes getting out of breath sooner with activity than she used to. This has not gotten worse recently. Amalie denies shortness of breath at rest or orthopnea.  Other fatigue -     EKG 12-Lead -     Vitamin B12  SOB (shortness of breath)  on exertion  Depression screen  Class 2 obesity due to excess calories without serious comorbidity with body mass index (BMI) of 37.0 to 37.9 in adult Assessment & Plan: Contributing factors: Limited food variety, chronic skipping of meals low volume of physical activity, high levels of stress, suspected sleep apnea.  See obesity treatment plan  Orders: -     CBC with Differential/Platelet -     Hemoglobin A1c -     Insulin , random -     VITAMIN D  25 Hydroxy (Vit-D Deficiency, Fractures) -     Lipid Panel With LDL/HDL Ratio  Essential (primary) hypertension Assessment & Plan: Repeat blood pressure 170/100 she is at prior elevations in the past.  She does not have a primary care doctor has been using mostly walk-in clinics in [place].  She has a blood pressure machine at home and was educated on how to properly measure her blood pressure today.  She will start monitoring in the morning and also before bedtime.  Will going to start her on amlodipine  2.5 mg starting today she may increase medication to 5 mg over the next 48 to 72 hours if her blood pressure is still above 140/90.  Patient advised against energy drinks she had been drinking Celsius.  Also to avoid taking NSAIDs and maintain a diet low in sodium.  Please refer to orders  Orders: -     Comprehensive metabolic panel with GFR -     TSH -     amLODIPine  Besylate; Take 1 tablet (2.5 mg total) by mouth daily.  Dispense: 30 tablet; Refill: 0 -     Aldosterone + renin activity w/  ratio  Suspected sleep apnea Assessment & Plan: Livvy admits to daytime somnolence and admits to waking up still tired. Patient has a history of symptoms of daytime fatigue, morning fatigue, and morning headache. Daisa generally gets 6 hours of sleep per night, and states that she has poor sleep quality. Snoring is present. Apneic episodes are not present. Epworth Sleepiness Score is 13.  Also has elevated blood pressure.  Patient will be referred for polysomnography at the next office visit     Follow-up  She was informed of the importance of frequent follow-up visits to maximize her success with intensive lifestyle modifications for her multiple health conditions. She was informed we would discuss her lab results at her next visit unless there is a critical issue that needs to be addressed sooner. Jonne agreed to keep her next visit at the agreed upon time to discuss these results.  Attestation Statement  This is the patient's intake visit at [facility]. The patient's Health Questionnaire was reviewed at length. Included in the packet: current and past health history, medications, allergies, ROS, gynecologic history (women only), surgical history, family history, social history, weight history, weight loss surgery history (for those that have had weight loss surgery), nutritional evaluation, mood and food questionnaire, PHQ9, Epworth questionnaire, sleep habits questionnaire, patient life and health improvement goals questionnaire. These will all be scanned into the patient's chart under media.   During the visit, I independently reviewed the patient's EKG, previous labs, bioimpedance scale results, and indirect calorimetry results. I used this information to medically tailor a meal plan for the patient that will help her to lose weight and will improve her obesity-related conditions. I performed a medically necessary appropriate examination and/or evaluation. I discussed  the assessment and treatment plan with the patient. The patient was provided an opportunity to ask  questions and all were answered. The patient agreed with the plan and demonstrated an understanding of the instructions. Labs were ordered at this visit and will be reviewed at the next visit unless critical results need to be addressed immediately. Clinical information was updated and documented in the EMR.   In addition, they received basic education on identification of processed foods and reduction of these, different sources of lean proteins and complex carbohydrates and how to eat balanced by incorporation of whole foods.  Reviewed by clinician on day of visit: allergies, medications, problem list, medical history, surgical history, family history, social history, and previous encounter notes.  I have spent 51 minutes in the care of the patient today including: 5 minutes before the visit reviewing and prepping the chart 39 minutes face-to-face assessing and reviewing listed medical problems as outlined in obesity care plan, providing nutritional and behavioral counseling as outlined in obesity care plan, independently interpreting results and goals of care, see listed medical problems, discussing biometric information and progress, reviewing pertinent diagnostics which are listed under medical problems, ordering diagnostics - see orders, and ordering medications - see orders 7 minutes after the visit updating chart and documentation     Ladd Picker, MD

## 2023-11-17 LAB — COMPREHENSIVE METABOLIC PANEL WITH GFR
ALT: 25 IU/L (ref 0–32)
AST: 21 IU/L (ref 0–40)
Albumin: 4.3 g/dL (ref 3.9–4.9)
Alkaline Phosphatase: 60 IU/L (ref 44–121)
BUN/Creatinine Ratio: 13 (ref 9–23)
BUN: 10 mg/dL (ref 6–20)
Bilirubin Total: <0.2 mg/dL (ref 0.0–1.2)
CO2: 21 mmol/L (ref 20–29)
Calcium: 9.3 mg/dL (ref 8.7–10.2)
Chloride: 104 mmol/L (ref 96–106)
Creatinine, Ser: 0.8 mg/dL (ref 0.57–1.00)
Globulin, Total: 2.4 g/dL (ref 1.5–4.5)
Glucose: 85 mg/dL (ref 70–99)
Potassium: 4.1 mmol/L (ref 3.5–5.2)
Sodium: 139 mmol/L (ref 134–144)
Total Protein: 6.7 g/dL (ref 6.0–8.5)
eGFR: 97 mL/min/{1.73_m2} (ref >59)

## 2023-11-17 LAB — CBC WITH DIFFERENTIAL/PLATELET
Basophils Absolute: 0.1 10*3/uL (ref 0.0–0.2)
Basos: 1 %
EOS (ABSOLUTE): 0.2 10*3/uL (ref 0.0–0.4)
Eos: 2 %
Hematocrit: 34.7 % (ref 34.0–46.6)
Hemoglobin: 10 g/dL — ABNORMAL LOW (ref 11.1–15.9)
Immature Grans (Abs): 0 10*3/uL (ref 0.0–0.1)
Immature Granulocytes: 0 %
Lymphocytes Absolute: 2.6 10*3/uL (ref 0.7–3.1)
Lymphs: 34 %
MCH: 20.7 pg — ABNORMAL LOW (ref 26.6–33.0)
MCHC: 28.8 g/dL — ABNORMAL LOW (ref 31.5–35.7)
MCV: 72 fL — ABNORMAL LOW (ref 79–97)
Monocytes Absolute: 0.5 10*3/uL (ref 0.1–0.9)
Monocytes: 6 %
Neutrophils Absolute: 4.3 10*3/uL (ref 1.4–7.0)
Neutrophils: 57 %
Platelets: 494 10*3/uL — ABNORMAL HIGH (ref 150–450)
RBC: 4.82 x10E6/uL (ref 3.77–5.28)
RDW: 17.5 % — ABNORMAL HIGH (ref 11.7–15.4)
WBC: 7.6 10*3/uL (ref 3.4–10.8)

## 2023-11-17 LAB — LIPID PANEL WITH LDL/HDL RATIO
Cholesterol, Total: 178 mg/dL (ref 100–199)
HDL: 59 mg/dL (ref >39)
LDL Chol Calc (NIH): 98 mg/dL (ref 0–99)
LDL/HDL Ratio: 1.7 ratio (ref 0.0–3.2)
Triglycerides: 118 mg/dL (ref 0–149)
VLDL Cholesterol Cal: 21 mg/dL (ref 5–40)

## 2023-11-17 LAB — VITAMIN B12: Vitamin B-12: 480 pg/mL (ref 232–1245)

## 2023-11-17 LAB — HEMOGLOBIN A1C
Est. average glucose Bld gHb Est-mCnc: 111 mg/dL
Hgb A1c MFr Bld: 5.5 % (ref 4.8–5.6)

## 2023-11-17 LAB — VITAMIN D 25 HYDROXY (VIT D DEFICIENCY, FRACTURES): Vit D, 25-Hydroxy: 6.1 ng/mL — ABNORMAL LOW (ref 30.0–100.0)

## 2023-11-17 LAB — INSULIN, RANDOM: INSULIN: 21.4 u[IU]/mL (ref 2.6–24.9)

## 2023-11-17 LAB — TSH: TSH: 4.33 u[IU]/mL (ref 0.450–4.500)

## 2023-11-22 LAB — ALDOSTERONE + RENIN ACTIVITY W/ RATIO
Aldos/Renin Ratio: 16.4 (ref 0.0–30.0)
Aldosterone: 7.3 ng/dL (ref 0.0–30.0)
Renin Activity, Plasma: 0.446 ng/mL/h (ref 0.167–5.380)

## 2023-11-27 ENCOUNTER — Encounter: Payer: Self-pay | Admitting: Physician Assistant

## 2023-11-27 ENCOUNTER — Ambulatory Visit
Admission: RE | Admit: 2023-11-27 | Discharge: 2023-11-27 | Disposition: A | Discharge status: Home / Self Care | Source: Ambulatory Visit | Attending: Physician Assistant | Admitting: Physician Assistant

## 2023-11-27 ENCOUNTER — Ambulatory Visit: Admitting: Physician Assistant

## 2023-11-27 VITALS — BP 134/86 | HR 92 | Ht 61.0 in | Wt 201.1 lb

## 2023-11-27 DIAGNOSIS — R1011 Right upper quadrant pain: Secondary | ICD-10-CM | POA: Diagnosis not present

## 2023-11-27 DIAGNOSIS — K582 Mixed irritable bowel syndrome: Secondary | ICD-10-CM

## 2023-11-27 DIAGNOSIS — R1032 Left lower quadrant pain: Secondary | ICD-10-CM | POA: Diagnosis not present

## 2023-11-27 DIAGNOSIS — K5732 Diverticulitis of large intestine without perforation or abscess without bleeding: Secondary | ICD-10-CM

## 2023-11-27 DIAGNOSIS — R1012 Left upper quadrant pain: Secondary | ICD-10-CM | POA: Diagnosis not present

## 2023-11-27 DIAGNOSIS — Z9049 Acquired absence of other specified parts of digestive tract: Secondary | ICD-10-CM

## 2023-11-27 DIAGNOSIS — Z8719 Personal history of other diseases of the digestive system: Secondary | ICD-10-CM

## 2023-11-27 DIAGNOSIS — R1084 Generalized abdominal pain: Secondary | ICD-10-CM

## 2023-11-27 DIAGNOSIS — D509 Iron deficiency anemia, unspecified: Secondary | ICD-10-CM | POA: Diagnosis not present

## 2023-11-27 DIAGNOSIS — R197 Diarrhea, unspecified: Secondary | ICD-10-CM

## 2023-11-27 MED ORDER — DICYCLOMINE HCL 20 MG PO TABS: 20.0000 mg | ORAL_TABLET | Freq: Three times a day (TID) | ORAL | 0 refills | Status: DC | PRN

## 2023-11-27 NOTE — Progress Notes (Signed)
 11/27/2023 Laura Mejia [id number] 13-May-1986  Referring provider: No ref. provider found Primary GI doctor: Dr. Venice Gillis  ASSESSMENT AND PLAN:  IDA 11/16/2023  HGB 10.0 MCV 72 Platelets 494 11/19/2022 Iron 24 Ferritin 23.3 B12 480 Normal EGD and colonoscopy 07/06/2023 Possibly secondary to menorrhagia versus thalassemia Recent Labs    04/15/23 1242 11/16/23 0950  HGB 11.0* 10.0*  Follow up with OB/GYN  History of uncomplicated diverticulitis CT 11/11/2022 diverticulitis to the distal descending proximal sigmoid Treated with Cipro  Flagyl  Normal colonoscopy 07/06/2023 No further symptoms, if recurrent symptoms consider referral to general surgery in the future  Abdominal pain  LUQ/LLQ AB pain x 3 weeks, no fever, chills last a minute and resolved.  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 female with a past medical history listed below presents for evaluation of abdominal discomfort previous diverticulitis normal EGD colonoscopy 07/06/2023, requesting FMLA paperwork.   Discussed the use of AI scribe software for clinical note transcription with the patient, who gave verbal consent to proceed.  History of Present Illness   Laura Mejia is a 38 year old female who presents with ongoing abdominal pain and  gastrointestinal symptoms.  She has been experiencing persistent abdominal pain and gastrointestinal symptoms since being diagnosed with uncomplicated diverticulitis in April 2024. Despite treatment and normal endoscopy and colonoscopy results, she continues to have abdominal pain, particularly on the left side, which she associates with eating and her previous gallbladder removal.  The abdominal pain is variable and persistent, occurring when she eats. She is participating in a healthy weight and wellness program to improve her diet, but certain foods still unpredictably upset her stomach, leading to immediate diarrhea. This has impacted her ability to eat at work due to the lack of private bathrooms.  She experiences a new type of pain on the left side, described as recent and different from her usual symptoms. This pain is not associated with eating or diarrhea, lasts about a minute, and occurs sporadically throughout the day. No fever, chills, or symptoms reminiscent of diverticulitis are present.  Her gastrointestinal symptoms have worsened since her gallbladder removal in 2008, with diarrhea occurring almost daily if she eats normally. Recently, she has experienced more constipation, which she attributes to dietary changes. She takes an over-the-counter acid reducer as needed for acid reflux symptoms.  She works as a Administrator, arts for [employer] and is trying to maintain a diet of three meals and snacks under 1200 calories per day. She is concerned about the impact of her symptoms on her work and daily life.      She  reports that she has never smoked. She has never used smokeless tobacco. She reports that she does not currently use alcohol. She reports that she does not use drugs.  RELEVANT GI HISTORY, IMAGING AND LABS: Results   RADIOLOGY Abdominal CT: Normal spleen  DIAGNOSTIC Colonoscopy: Normal (April 2024) Endoscopy: Normal (April 2024)      CBC    Component Value  Date/Time   WBC 7.6 11/16/2023 0950   WBC 7.2 04/15/2023 1242   RBC 4.82 11/16/2023 0950   RBC 5.19 (H) 04/15/2023 1242   HGB 10.0 (L) 11/16/2023 0950   HCT 34.7 11/16/2023 0950   PLT 494 (H) 11/16/2023 0950   MCV 72 (L) 11/16/2023 0950   MCH 20.7 (L) 11/16/2023 0950   MCH 22.6 (L) 11/11/2022 0400   MCHC 28.8 (L) 11/16/2023 0950   MCHC 30.2 04/15/2023 1242   RDW 17.5 (H) 11/16/2023 0950   LYMPHSABS 2.6 11/16/2023 0950   MONOABS 0.4 04/15/2023 1242   EOSABS 0.2 11/16/2023 0950   BASOSABS 0.1 11/16/2023 0950   Recent Labs    04/15/23 1242 11/16/23 0950  HGB 11.0* 10.0*    CMP     Component Value Date/Time   NA 139 11/16/2023 0950   K 4.1 11/16/2023 0950   CL 104 11/16/2023 0950   CO2 21 11/16/2023 0950   GLUCOSE 85 11/16/2023 0950   GLUCOSE 104 (H) 04/15/2023 1242   BUN 10 11/16/2023 0950   CREATININE 0.80 11/16/2023 0950   CALCIUM 9.3 11/16/2023 0950   PROT 6.7 11/16/2023 0950   ALBUMIN 4.3 11/16/2023 0950   AST 21 11/16/2023 0950   ALT 25 11/16/2023 0950   ALKPHOS 60 11/16/2023 0950   BILITOT <0.2 11/16/2023 0950   GFRNONAA >60 11/11/2022 0400   GFRAA >90 09/09/2011 1545      Latest Ref Rng & Units 11/16/2023    9:50 AM 04/15/2023   12:42 PM 11/19/2022   12:22 PM  Hepatic Function  Total Protein 6.0 - 8.5 g/dL 6.7  7.5  7.3   Albumin 3.9 - 4.9 g/dL 4.3  4.2  3.8   AST 0 - 40 IU/L 21  19  17    ALT 0 - 32 IU/L 25  20  21    Alk Phosphatase 44 - 121 IU/L 60  47  42   Total Bilirubin 0.0 - 1.2 mg/dL <4.0  0.4  0.2       Current Medications:        Current Outpatient Medications (Other):    dicyclomine  (BENTYL ) 20 MG tablet, Take 1 tablet (20 mg total) by mouth 3 (three) times daily as needed for spasms.  Medical History:  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)    Depression    Diverticulitis    Gallbladder  problem    GERD (gastroesophageal reflux disease)    Heart murmur    Palpitations    Vitamin D  deficiency    Allergies: Not on File   Surgical History:  She  has a past surgical history that includes Cholecystectomy and Cesarean section. Family History:  Her family history includes Anxiety disorder in her mother; Bipolar disorder in her mother; Bone cancer in her mother; Depression in her mother; Diabetes in her father; Heart disease in her father; Hypertension in her father and mother; Obesity in her mother; Stroke in her mother.  REVIEW OF SYSTEMS  : All other systems reviewed and negative except where noted in the History of Present Illness.  PHYSICAL EXAM: BP 134/86  Pulse 92   Ht 5\' 1"  (1.549 m)   Wt 201 lb 2 oz (91.2 kg)   LMP 11/06/2023   BMI 38.00 kg/m  Physical Exam   GENERAL APPEARANCE: Well nourished, in no apparent distress. HEENT: No cervical lymphadenopathy, unremarkable thyroid, sclerae anicteric, conjunctiva pink. RESPIRATORY: Respiratory effort normal, breath sounds equal bilaterally without rales, rhonchi, or wheezing. CARDIO: Regular rate and rhythm with no murmurs, rubs, or gallops, peripheral pulses intact. ABDOMEN: Soft, non-distended, active bowel sounds in all four quadrants, mild tenderness in left upper quadrant, no rebound, no mass appreciated. RECTAL: Declines. MUSCULOSKELETAL: Full range of motion, normal gait, without edema. SKIN: Dry, intact without rashes or lesions. No jaundice. NEURO: Alert, oriented, no focal deficits. PSYCH: Cooperative, normal mood and affect.      Edmonia Gottron, PA-C 10:59 AM

## 2023-11-27 NOTE — Patient Instructions (Addendum)
 Your provider has requested that you have an abdominal x ray before leaving today. Please go to the basement floor to our Radiology department for the test.  First do a trial off milk/lactose products if you use them.  Add fiber like benefiber or citracel once a day Increase activity Can do trial of IBGard which is over the counter for AB pain- Take 1-2 capsules once a day for maintence or twice a day during a flare For IBS and peppermint oil.  Peppermint oil has been proven to be better than placebo for cramping for IBS Stop if it worsens heart burn or causes flushing of your face.  Ideally enteric coated peppermint oil capsules 2 a day is best but if you got the oil, you can use 0.76ml or 180 mg of pepperment oil up to 3 x a day.   Can send in an anti spasm medication, Bentyl , to take as needed  Follow up in 4-6 months.  Thank you for trusting me with your gastrointestinal care!   Santina Cull, PA-C    FODMAP stands for fermentable oligo-, di-, mono-saccharides and polyols (1). These are the scientific terms used to classify groups of carbs that are difficult for our body to digest and that are notorious for triggering digestive symptoms like bloating, gas, loose stools and stomach pain.   You can try low FODMAP diet  - start with eliminating just one column at a time that you feel may be a trigger for you. - the table at the very bottom contains foods that are low in FODMAPs   Sometimes trying to eliminate the FODMAP's from your diet is difficult or tricky, if you are stuggling with trying to do the elimination diet you can try an enzyme.  There is a food enzymes that you sprinkle in or on your food that helps break down the FODMAP. You can read more about the enzyme by going to this site: https://fodzyme.com/

## 2023-11-30 ENCOUNTER — Ambulatory Visit (INDEPENDENT_AMBULATORY_CARE_PROVIDER_SITE_OTHER): Admitting: Internal Medicine

## 2023-11-30 ENCOUNTER — Encounter (INDEPENDENT_AMBULATORY_CARE_PROVIDER_SITE_OTHER): Payer: Self-pay | Admitting: Internal Medicine

## 2023-11-30 VITALS — BP 138/84 | HR 72 | Temp 98.9°F | Ht 61.0 in | Wt 195.0 lb

## 2023-11-30 DIAGNOSIS — D509 Iron deficiency anemia, unspecified: Secondary | ICD-10-CM | POA: Insufficient documentation

## 2023-11-30 DIAGNOSIS — E88819 Insulin resistance, unspecified: Secondary | ICD-10-CM | POA: Insufficient documentation

## 2023-11-30 DIAGNOSIS — R29818 Other symptoms and signs involving the nervous system: Secondary | ICD-10-CM | POA: Diagnosis not present

## 2023-11-30 DIAGNOSIS — I10 Essential (primary) hypertension: Secondary | ICD-10-CM | POA: Diagnosis not present

## 2023-11-30 DIAGNOSIS — Z6836 Body mass index (BMI) 36.0-36.9, adult: Secondary | ICD-10-CM

## 2023-11-30 DIAGNOSIS — D649 Anemia, unspecified: Secondary | ICD-10-CM | POA: Insufficient documentation

## 2023-11-30 DIAGNOSIS — D508 Other iron deficiency anemias: Secondary | ICD-10-CM

## 2023-11-30 DIAGNOSIS — E6609 Other obesity due to excess calories: Secondary | ICD-10-CM

## 2023-11-30 DIAGNOSIS — E66812 Obesity, class 2: Secondary | ICD-10-CM

## 2023-11-30 DIAGNOSIS — E559 Vitamin D deficiency, unspecified: Secondary | ICD-10-CM | POA: Insufficient documentation

## 2023-11-30 MED ORDER — AMLODIPINE BESYLATE 2.5 MG PO TABS: 2.5000 mg | ORAL_TABLET | Freq: Every day | ORAL | 0 refills | Status: DC

## 2023-11-30 MED ORDER — VITAMIN D (ERGOCALCIFEROL) 1.25 MG (50000 UNIT) PO CAPS
50000.0000 [IU] | ORAL_CAPSULE | ORAL | 0 refills | Status: DC
Start: 2023-11-30 — End: 2024-05-31

## 2023-11-30 NOTE — Assessment & Plan Note (Signed)
 Last office visit I started her on amlodipine  2.5 mg a day her blood pressure is now better.  I reviewed most recent renal parameters which showed normal GFR electrolytes and also renin aldosterone activity.  She will continue amlodipine  and current weight management strategy.  Losing 10% of body weight may improve blood pressure control

## 2023-11-30 NOTE — Assessment & Plan Note (Signed)
 Insulin  resistance is indicated by elevated insulin  levels, contributing to weight gain and fatigue. Dietary modifications focusing on low carbohydrate intake are recommended to manage insulin  levels. - Advise on maintaining a low carbohydrate diet with more fruits, vegetables, and whole grains. - Avoid sugary drinks and high-carb foods. - Consider pharmacoprophylaxis with metformin

## 2023-11-30 NOTE — Progress Notes (Unsigned)
 Office: [phone]  /  Fax: [phone]  Weight Summary And Biometrics  Vitals Temp: 98.9 F (37.2 C) BP: 138/84 Pulse Rate: 72 SpO2: 100 %   Anthropometric Measurements Height: 5\' 1"  (1.549 m) Weight: 195 lb (88.5 kg) BMI (Calculated): 36.86 Weight at Last Visit: 203 lb Weight Lost Since Last Visit: 8 lb Weight Gained Since Last Visit: 0 lb Starting Weight: 203 lb Total Weight Loss (lbs): 8 lb (3.629 kg) Peak Weight: 208 lb   Body Composition  Body Fat %: 44.1 % Fat Mass (lbs): 86.2 lbs Muscle Mass (lbs): 103.6 lbs Total Body Water (lbs): 77.6 lbs Visceral Fat Rating : 11    No data recorded Today's Visit #: 2  Starting Date: 11/16/23   Subjective   Chief Complaint: Obesity  Interval History  Laura Mejia is a 38 year old female with hypertension who presents for a post intake appointment for medical weight management.  She is adhering to a 1200 calorie nutritional plan with approximately 90% compliance, focusing on whole foods and calorie tracking. She exercises once a week for 60 to 75 minutes. Bioimpedance data shows a reduction in body fat percentage, preservation of muscle mass, and a decrease in fat mass and visceral fat. She feels like she is eating more now than before and is learning to read food labels and prepare her meals.  She has hypertension and has started taking amlodipine , which has improved her blood pressure. She reports increased energy during the day, no longer feels sleepy in the middle of the day, and can work past her first break without feeling tired.  She is suspected to have sleep apnea, waking herself up with snoring. She has a narrow upper airway and a neck circumference of 15.5 inches, close to the high-risk threshold for sleep apnea. Despite this, she reports having more energy and not feeling as sleepy during the day.  Recent blood work shows normal kidney and liver function, normal blood sugar, and good cholesterol  levels. However, she is very deficient in vitamin D  and has iron deficiency anemia, which she has had all her life. She has not been taking iron supplements consistently and has not seen her primary care physician in about a year and a half. Her insulin  levels are elevated, indicating insulin  resistance, which may be contributing to her weight issues.  Challenges affecting patient progress: work schedule.    Pharmacotherapy for weight management: She is currently taking no anti-obesity medication.   Assessment and Plan   Treatment Plan For Obesity:  Recommended Dietary Goals  Laura Mejia is currently in the action stage of change. As such, her goal is to continue weight management plan. She has agreed to: follow a tailored, multi-day, low carbohydrate, high protein plan targeting 1200 calories and 90 grams of protein per day and continue current plan  Behavioral Health and Counseling  We discussed the following behavioral modification strategies today: {EMWMwtlossstrategies:28914::"continue to work on maintaining a reduced calorie state, getting the recommended amount of protein, incorporating whole foods, making healthy choices, staying well hydrated and practicing mindfulness when eating."}.  Additional education and resources provided today: Handout and personalized instruction on tracking and journaling using Apps and personal demonstration on tracking and journaling  Recommended Physical Activity Goals  Laura Mejia has been advised to work up to 150 minutes of moderate intensity aerobic activity a week and strengthening exercises 2-3 times per week for cardiovascular health, weight loss maintenance and preservation of muscle mass.   She has agreed to :  Think about enjoyable ways to increase daily physical activity and overcoming barriers to exercise and Increase physical activity in their day and reduce sedentary time (increase NEAT).  Pharmacotherapy  We discussed various medication  options to help Laura Mejia with her weight loss efforts and we both agreed to : continue with nutritional and behavioral strategies  Associated Conditions Impacted by Obesity Treatment  There are no diagnoses linked to this encounter.     Insulin  resistance   Iron deficiency anemia Chronic iron deficiency anemia is present, likely due to inadequate iron intake or absorption. Low hemoglobin levels contribute to fatigue and elevated platelet count. She has not been compliant with iron supplementation. - Schedule follow-up with PCP for further evaluation and management. - Discuss potential need for iron supplementation and further diagnostic evaluation.  Vitamin D  deficiency Severe vitamin D  deficiency with levels at 6, significantly below the normal range. This deficiency may contribute to fatigue, immune dysfunction, and weight gain. Darker skin and limited sun exposure are contributing factors. - Prescribe ergocalciferol  50,000 IU once weekly for 4 months.  Suspected sleep apnea Suspected sleep apnea due to high sleepiness scale and narrow upper airway. She reports snoring that wakes her up. Weight loss may improve symptoms, and current energy levels have improved, suggesting possible improvement in sleep quality. - Consider sleep study if symptoms persist or worsen. - Encourage weight loss to improve symptoms.  General Health Maintenance Engaged in a weight management program with a 1200 calorie diet and regular exercise. She is tracking nutrition and making dietary changes to support weight loss and overall health, losing more than two pounds per week while maintaining muscle mass. - Provide a 7-day meal plan to support dietary goals. - Introduce MyNet Diary app for tracking nutrition and macros. - Encourage continued adherence to dietary and exercise regimen.  Follow-up Follow-up is necessary to monitor progress in weight management, hypertension, and anemia. Coordination with PCP is  needed for comprehensive care. She is considering changing her PCP for better management of her conditions. - Schedule follow-up appointment in 3-4 weeks to assess progress and adjust treatment as necessary. - Ensure follow-up with PCP for anemia management and potential change of PCP if current care is unsatisfactory.   Objective   Physical Exam:  Blood pressure 138/84, pulse 72, temperature 98.9 F (37.2 C), height 5\' 1"  (1.549 m), weight 195 lb (88.5 kg), last menstrual period 11/30/2023, SpO2 100%. Body mass index is 36.84 kg/m.  General: She is overweight, cooperative, alert, well developed, and in no acute distress. PSYCH: Has normal mood, affect and thought process.   HEENT: EOMI, sclerae are anicteric. Lungs: Normal breathing effort, no conversational dyspnea. Extremities: No edema.  Neurologic: No gross sensory or motor deficits. No tremors or fasciculations noted.    Diagnostic Data Reviewed:  BMET    Component Value Date/Time   NA 139 11/16/2023 0950   K 4.1 11/16/2023 0950   CL 104 11/16/2023 0950   CO2 21 11/16/2023 0950   GLUCOSE 85 11/16/2023 0950   GLUCOSE 104 (H) 04/15/2023 1242   BUN 10 11/16/2023 0950   CREATININE 0.80 11/16/2023 0950   CALCIUM 9.3 11/16/2023 0950   GFRNONAA >60 11/11/2022 0400   GFRAA >90 09/09/2011 1545   Lab Results  Component Value Date   HGBA1C 5.5 11/16/2023   Lab Results  Component Value Date   INSULIN  21.4 11/16/2023   Lab Results  Component Value Date   TSH 4.330 11/16/2023   CBC    Component Value  Date/Time   WBC 7.6 11/16/2023 0950   WBC 7.2 04/15/2023 1242   RBC 4.82 11/16/2023 0950   RBC 5.19 (H) 04/15/2023 1242   HGB 10.0 (L) 11/16/2023 0950   HCT 34.7 11/16/2023 0950   PLT 494 (H) 11/16/2023 0950   MCV 72 (L) 11/16/2023 0950   MCH 20.7 (L) 11/16/2023 0950   MCH 22.6 (L) 11/11/2022 0400   MCHC 28.8 (L) 11/16/2023 0950   MCHC 30.2 04/15/2023 1242   RDW 17.5 (H) 11/16/2023 0950   Iron Studies     Component Value Date/Time   IRON 24 (L) 11/19/2022 1222   TIBC 368.2 11/19/2022 1222   FERRITIN 23.3 11/19/2022 1222   IRONPCTSAT 6.5 (L) 11/19/2022 1222   Lipid Panel     Component Value Date/Time   CHOL 178 11/16/2023 0950   TRIG 118 11/16/2023 0950   HDL 59 11/16/2023 0950   LDLCALC 98 11/16/2023 0950   Hepatic Function Panel     Component Value Date/Time   PROT 6.7 11/16/2023 0950   ALBUMIN 4.3 11/16/2023 0950   AST 21 11/16/2023 0950   ALT 25 11/16/2023 0950   ALKPHOS 60 11/16/2023 0950   BILITOT <0.2 11/16/2023 0950      Component Value Date/Time   TSH 4.330 11/16/2023 0950   Nutritional Lab Results  Component Value Date   VD25OH 6.1 (L) 11/16/2023    Medications: Outpatient Encounter Medications as of 11/30/2023  Medication Sig   dicyclomine  (BENTYL ) 20 MG tablet Take 1 tablet (20 mg total) by mouth 3 (three) times daily as needed for spasms.   No facility-administered encounter medications on file as of 11/30/2023.     Follow-Up   No follow-ups on file.Aaron Aas She was informed of the importance of frequent follow up visits to maximize her success with intensive lifestyle modifications for her multiple health conditions.  Attestation Statement   Reviewed by clinician on day of visit: allergies, medications, problem list, medical history, surgical history, family history, social history, and previous encounter notes.     Ladd Picker, MD

## 2023-11-30 NOTE — Assessment & Plan Note (Signed)
 Most recent vitamin D  levels  Lab Results  Component Value Date   VD25OH 6.1 (L) 11/16/2023     Deficiency state associated with adiposity and may result in leptin resistance, weight gain and fatigue.  Plan: After discussion of benefits, alternative treatment options and side effects patient will be started on vitamin D2 50,000 units 1 tablet weekly for 3-4 months. for a treatment goal level of 50-60 mg/dl. Check levels at that time for response monitoring.

## 2023-12-01 ENCOUNTER — Encounter (INDEPENDENT_AMBULATORY_CARE_PROVIDER_SITE_OTHER): Payer: Self-pay

## 2023-12-01 NOTE — Assessment & Plan Note (Signed)
 She has hypertension and had a high Epworth at initial intake.  Since she made dietary changes she feels she is sleeping better and has more energy.  Her neck circumference is 15.5 inches and she has a Mallampati of 3.  Suspected sleep apnea due to high sleepiness scale and narrow upper airway. She reports snoring that wakes her up. Weight loss may improve symptoms, and current energy levels have improved, suggesting possible improvement in sleep quality. - Consider sleep study if symptoms persist or worsen. - Encourage weight loss to improve symptoms.

## 2023-12-01 NOTE — Assessment & Plan Note (Signed)
 Her hematological parameters have microcytic indices with a mild degree of anemia.  She reports having lifelong history of anemia.  She still has her cycle which she describes as not heavy.  She is not taking any iron supplementation.  There are no signs or symptoms of GI bleeding.  She is in the middle of finding another primary care provider.  I recommend that she schedule an appointment to address her anemia as he is having problems with this then we will proceed with initial workup.

## 2023-12-01 NOTE — Assessment & Plan Note (Signed)
 She has lost 8 pounds with BIA suggesting improvements in subcutaneous fat and visceral fat.  Good appetite control.  Patient also tracking and journaling.  Today we provided a demonstration on how to use tracking app and using artificial intelligence.  Continue with current weight management strategy

## 2023-12-02 ENCOUNTER — Telehealth (INDEPENDENT_AMBULATORY_CARE_PROVIDER_SITE_OTHER): Payer: Self-pay | Admitting: Adult Health

## 2023-12-02 NOTE — Telephone Encounter (Signed)
 Hello!  They called asking about the work parameters Laura Mejia suggested.  Is the medical accomodation enforcing the 8hr daily work schedule or can overtime be considered?   She asked for a call back and that phone number goes directly to her, no extension.

## 2023-12-08 ENCOUNTER — Telehealth (INDEPENDENT_AMBULATORY_CARE_PROVIDER_SITE_OTHER): Payer: Self-pay | Admitting: Adult Health

## 2023-12-08 NOTE — Telephone Encounter (Signed)
 Please review fax from [employer] in reference to medical accommodations and fill in blanks.

## 2023-12-09 ENCOUNTER — Ambulatory Visit: Payer: Self-pay | Admitting: Physician Assistant

## 2023-12-16 ENCOUNTER — Telehealth (INDEPENDENT_AMBULATORY_CARE_PROVIDER_SITE_OTHER): Payer: Self-pay | Admitting: Internal Medicine

## 2023-12-16 NOTE — Telephone Encounter (Signed)
 Laura Mejia from [employer]. She is refaxing the form indicating what she needs clarification on. Her phone number is: number is [phone] if you have any questions. The form was initially submitted by Dr. Allie Area.

## 2023-12-16 NOTE — Telephone Encounter (Signed)
 A [employer] Medical [employer] form has been fill out and signed by Mr Laura Mejia and faxed to [phone]. Confirmation was sent back.

## 2023-12-19 ENCOUNTER — Other Ambulatory Visit: Payer: Self-pay | Admitting: Physician Assistant

## 2024-01-11 ENCOUNTER — Ambulatory Visit (INDEPENDENT_AMBULATORY_CARE_PROVIDER_SITE_OTHER): Admitting: Adult Health

## 2024-01-11 ENCOUNTER — Encounter (INDEPENDENT_AMBULATORY_CARE_PROVIDER_SITE_OTHER): Payer: Self-pay | Admitting: Adult Health

## 2024-01-11 VITALS — BP 127/79 | HR 81 | Temp 99.1°F | Ht 61.0 in | Wt 193.0 lb

## 2024-01-11 DIAGNOSIS — E669 Obesity, unspecified: Secondary | ICD-10-CM | POA: Diagnosis not present

## 2024-01-11 DIAGNOSIS — E559 Vitamin D deficiency, unspecified: Secondary | ICD-10-CM | POA: Diagnosis not present

## 2024-01-11 DIAGNOSIS — Z6836 Body mass index (BMI) 36.0-36.9, adult: Secondary | ICD-10-CM

## 2024-01-11 DIAGNOSIS — E66812 Obesity, class 2: Secondary | ICD-10-CM

## 2024-01-11 DIAGNOSIS — Z0000 Encounter for general adult medical examination without abnormal findings: Secondary | ICD-10-CM

## 2024-01-11 DIAGNOSIS — I10 Essential (primary) hypertension: Secondary | ICD-10-CM | POA: Diagnosis not present

## 2024-01-11 DIAGNOSIS — E88819 Insulin resistance, unspecified: Secondary | ICD-10-CM | POA: Diagnosis not present

## 2024-01-11 NOTE — Progress Notes (Signed)
 WEIGHT SUMMARY AND BIOMETRICS  Vitals Temp: 99.1 F (37.3 C) BP: 127/79 Pulse Rate: 81 SpO2: 99 %   Anthropometric Measurements Height: 5' 1 (1.549 m) Weight: 193 lb (87.5 kg) BMI (Calculated): 36.49 Weight at Last Visit: 195 lb Weight Lost Since Last Visit: 2 lb Weight Gained Since Last Visit: 0 Starting Weight: 203 lb Total Weight Loss (lbs): 10 lb (4.536 kg) Peak Weight: 208 lb Waist Measurement : 41 inches   Body Composition  Body Fat %: 43.3 % Fat Mass (lbs): 83.6 lbs Muscle Mass (lbs): 103.8 lbs Total Body Water (lbs): 77.6 lbs Visceral Fat Rating : 10   Other Clinical Data Fasting: no Labs: no Today's Visit #: 3 Starting Date: 11/16/23    Chief Complaint:   OBESITY Laura Mejia is here to discuss her progress with her obesity treatment plan.  She is on the keeping a food journal and adhering to recommended goals of 1200 calories and 75g protein and states she is following her eating plan approximately 70 % of the time.  She states she is exercising Walking 60 minutes 2 times per week.  Interim History:  She has been tracking intake via MyNetDiary Reviewed on how to input data and check totals at end of day. Reviewed that her goals are 1200 calories with at least 75g protein  Reviewed Bioimpedance results with pt: Muscle Mass: +0.2 lb Adipose Mass: -2.6 lbs  Stress- she is still adjusting to her new work schedule, ie: Monday-Friday 0500-1700 Previously she worked [id number]  Her 38 year old son is working at [employer] over summer break- he is Chief Strategy Officer in highschool  Her 93 year old daughter returned home from [facility] late may 2025. She is likely not returning to  [facility], potentially transferring to [facility]   Subjective:   1. Insulin  resistance  Latest Reference Range & Units 11/16/23 09:50  INSULIN  2.6 - 24.9 uIU/mL 21.4    2. Vitamin D  deficiency  Latest Reference Range & Units 11/16/23 09:50  Vitamin D , 25-Hydroxy 30.0 - 100.0  ng/mL 6.1 (L)  (L): Data is abnormally low  She   3. Essential (primary) hypertension BP at goal at OV  11/16/2023:she was started on amlodipine  2.5mg  BP at that April OV was 165/95  She has not taken low dose CCB in 3 days She has not been checking home BP She denies CP with exertion  Reviewed the importance if daily adherence of antihypertensive therapy  4. Healthcare maintenance [employer] paperwork provided by pt today- FMLA to cover her for [employer] Paperwork had been completed multiple times, however it was the wrong paperwork. She requests FMLA to protect her ability for frequent f/u at [employer]  Assessment/Plan:   1. Insulin  resistance Increase protein and continue to increase walking  2. Vitamin D  deficiency (Primary) Continue weekly Ergocalciferol - denies need for refill today  3. Essential (primary) hypertension Take amlodipine  2.5mg  EVERYday Remain well hydrated with water Check home readings and bring to next OV  4. Healthcare maintenance Completed FMLA forms and fax to [employer]  5. Obesity (BMI 30-39.9), CURRENT BMI 36.49  Laura Mejia is currently in the action stage of change. As such, her goal is to continue with weight loss efforts. She has agreed to keeping a food journal and adhering to recommended goals of 1200 calories and 75g protein.   Exercise goals: All adults should avoid inactivity. Some physical activity is better than none, and adults who participate in any amount of physical activity  gain some health benefits. Adults should also include muscle-strengthening activities that involve all major muscle groups on 2 or more days a week. Increase daily walking, continue longer walks on weekend  Behavioral modification strategies: increasing lean protein intake, decreasing simple carbohydrates, increasing vegetables, increasing water intake, no skipping meals, meal planning and cooking strategies, keeping healthy foods in the home, ways to avoid boredom  eating, and planning for success.  Laura Mejia has agreed to follow-up with our clinic in 4 weeks. She was informed of the importance of frequent follow-up visits to maximize her success with intensive lifestyle modifications for her multiple health conditions.   Objective:   Blood pressure 127/79, pulse 81, temperature 99.1 F (37.3 C), height 5' 1 (1.549 m), weight 193 lb (87.5 kg), SpO2 99%. Body mass index is 36.47 kg/m.  General: Cooperative, alert, well developed, in no acute distress. HEENT: Conjunctivae and lids unremarkable. Cardiovascular: Regular rhythm.  Lungs: Normal work of breathing. Neurologic: No focal deficits.   Lab Results  Component Value Date   CREATININE 0.80 11/16/2023   BUN 10 11/16/2023   NA 139 11/16/2023   K 4.1 11/16/2023   CL 104 11/16/2023   CO2 21 11/16/2023   Lab Results  Component Value Date   ALT 25 11/16/2023   AST 21 11/16/2023   ALKPHOS 60 11/16/2023   BILITOT <0.2 11/16/2023   Lab Results  Component Value Date   HGBA1C 5.5 11/16/2023   Lab Results  Component Value Date   INSULIN  21.4 11/16/2023   Lab Results  Component Value Date   TSH 4.330 11/16/2023   Lab Results  Component Value Date   CHOL 178 11/16/2023   HDL 59 11/16/2023   LDLCALC 98 11/16/2023   TRIG 118 11/16/2023   Lab Results  Component Value Date   VD25OH 6.1 (L) 11/16/2023   Lab Results  Component Value Date   WBC 7.6 11/16/2023   HGB 10.0 (L) 11/16/2023   HCT 34.7 11/16/2023   MCV 72 (L) 11/16/2023   PLT 494 (H) 11/16/2023   Lab Results  Component Value Date   IRON 24 (L) 11/19/2022   TIBC 368.2 11/19/2022   FERRITIN 23.3 11/19/2022   Attestation Statements:   Reviewed by clinician on day of visit: allergies, medications, problem list, medical history, surgical history, family history, social history, and previous encounter notes.  "28 minutes spent face-to-face with the patient discussing management of disordered eating symptoms, reviewing  current medications, completing FMLA paperwork, and providing strategies for coping with emotional eating."  I have reviewed the above documentation for accuracy and completeness, and I agree with the above. -  Vrishank Moster d. Elba Dendinger,NP-C

## 2024-01-21 ENCOUNTER — Telehealth (INDEPENDENT_AMBULATORY_CARE_PROVIDER_SITE_OTHER): Payer: Self-pay | Admitting: Internal Medicine

## 2024-01-21 NOTE — Telephone Encounter (Signed)
 6/26 pt is calling about paperwork for her employer that should be turned in by the 1st, says she was told itll be sent at last appt

## 2024-01-25 NOTE — Telephone Encounter (Signed)
 Advised, via voicemail, paperwork has been faxed and paperwork is upfront to be picked up

## 2024-01-26 ENCOUNTER — Ambulatory Visit (INDEPENDENT_AMBULATORY_CARE_PROVIDER_SITE_OTHER): Admitting: Internal Medicine

## 2024-01-27 ENCOUNTER — Ambulatory Visit (INDEPENDENT_AMBULATORY_CARE_PROVIDER_SITE_OTHER): Admitting: Internal Medicine

## 2024-01-27 ENCOUNTER — Encounter (INDEPENDENT_AMBULATORY_CARE_PROVIDER_SITE_OTHER): Payer: Self-pay | Admitting: Internal Medicine

## 2024-01-27 VITALS — BP 134/89 | HR 74 | Temp 98.9°F | Ht 61.0 in | Wt 192.0 lb

## 2024-01-27 DIAGNOSIS — R29818 Other symptoms and signs involving the nervous system: Secondary | ICD-10-CM | POA: Diagnosis not present

## 2024-01-27 DIAGNOSIS — E88819 Insulin resistance, unspecified: Secondary | ICD-10-CM

## 2024-01-27 DIAGNOSIS — Z6837 Body mass index (BMI) 37.0-37.9, adult: Secondary | ICD-10-CM

## 2024-01-27 DIAGNOSIS — E66812 Obesity, class 2: Secondary | ICD-10-CM | POA: Diagnosis not present

## 2024-01-27 DIAGNOSIS — I10 Essential (primary) hypertension: Secondary | ICD-10-CM

## 2024-01-27 MED ORDER — METFORMIN HCL ER 500 MG PO TB24: 500.0000 mg | ORAL_TABLET | Freq: Every day | ORAL | 0 refills | Status: DC

## 2024-01-27 NOTE — Assessment & Plan Note (Signed)
 She has hypertension and had a high Epworth at initial intake.  Since she made dietary changes she feels she is sleeping better and has more energy.  Her neck circumference is 15.5 inches and she has a Mallampati of 3.  Suspected sleep apnea due to high sleepiness scale and narrow upper airway. She reports snoring that wakes her up. Weight loss may improve symptoms, and current energy levels have improved, suggesting possible improvement in sleep quality. - Consider sleep study if symptoms persist or worsen. - Encourage weight loss to improve symptoms.

## 2024-01-27 NOTE — Assessment & Plan Note (Signed)
 Blood pressure has improved repeat blood pressure was 132/83.  She is currently on amlodipine  2.5 mg once a day and will continue medication.  Losing 10% of body weight may improve blood pressure control.

## 2024-01-27 NOTE — Assessment & Plan Note (Signed)
 Insulin  resistance is indicated by elevated insulin  levels, contributing to weight gain and fatigue. Dietary modifications focusing on low carbohydrate intake are recommended to manage insulin  levels. - Advise on maintaining a low carbohydrate diet with more fruits, vegetables, and whole grains. - Avoid sugary drinks and high-carb foods. - Start metformin XR 500 mg 1 tablet daily for pharmacoprophylaxis

## 2024-01-27 NOTE — Assessment & Plan Note (Signed)
 She is undergoing medical weight management and has lost 11 pounds since April. Currently on a 1200 calorie diet, her weight loss has slowed. Her metabolic rate is approximately 1500 calories, requiring a calorie deficit closer to 1000 calories for a 1-pound weekly weight loss. Genetic factors, particularly in minority groups, impact metabolic rate and weight loss, necessitating a higher calorie deficit. Options include increasing physical activity, incorporating meal replacements, or intermittent fasting. Metformin is considered to help with insulin  resistance and weight management. Weight management injections like Wegovy or Zepbound are also discussed as potential options if covered by insurance. - Start metformin 1 tablet daily to help with insulin  resistance and weight management. - Consider intermittent fasting with an 8-hour eating window to help control calorie intake. - Check with insurance for coverage of weight management injections like Wegovy or Zepbound. - Consider using meal replacements for difficult meals to manage calorie intake. - Increase physical activity to help achieve a 1000 calorie deficit.`

## 2024-01-27 NOTE — Progress Notes (Signed)
 Office: [phone]  /  Fax: [phone]  Weight Summary and Body Composition Analysis (BIA)  Vitals Temp: 98.9 F (37.2 C) BP: 134/89 Pulse Rate: 74 SpO2: 98 %   Anthropometric Measurements Height: 5' 1 (1.549 m) Weight: 192 lb (87.1 kg) BMI (Calculated): 36.3 Weight at Last Visit: 193 lb Weight Lost Since Last Visit: 1 lb Weight Gained Since Last Visit: 0 lb Starting Weight: 203 lb Total Weight Loss (lbs): 11 lb (4.99 kg) Peak Weight: 208 lb   Body Composition  Body Fat %: 44.2 % Fat Mass (lbs): 85.2 lbs Muscle Mass (lbs): 102.2 lbs Total Body Water (lbs): 77.4 lbs Visceral Fat Rating : 11    RMR: 1728  Today's Visit #: 4  Starting Date: 11/16/23   Subjective   Chief Complaint: Obesity  Interval History Discussed the use of AI scribe software for clinical note transcription with the patient, who gave verbal consent to proceed.  History of Present Illness   KARLEIGH BUNTE is a 38 year old female who presents for medical weight management.  She is currently engaged in a medical weight management program and has lost one pound since her last visit. Her weight loss journey began in April, with an initial loss of eight pounds, followed by two more pounds, and most recently, one additional pound. She maintains a 1200 calorie diet and recognizes the need to increase physical activity to enhance her weight loss efforts.  Her daughter has returned home from college for the summer, impacting her ability to maintain her exercise routine. Previously, she walked weekly at the park but now finds it challenging due to increased responsibilities at home. She feels overwhelmed and notes that differing dietary preferences in her household complicate meal planning.  She uses a protein shake as a meal replacement for breakfast and is considering intermittent fasting to manage her eating schedule and calorie intake more effectively. She is also exploring the use of  appetite suppressants to aid in reducing her calorie intake.  Her sleep has improved, with less snoring noted. She has a home blood pressure monitor and is familiar with the proper technique for measuring her blood pressure.       Challenges affecting patient progress: none.    Pharmacotherapy for weight management: She is currently taking no anti-obesity medication.   Assessment and Plan   Treatment Plan For Obesity:  Recommended Dietary Goals  Valen is currently in the action stage of change. As such, her goal is to continue weight management plan. She has agreed to: continue current plan  Behavioral Health and Counseling  We discussed the following behavioral modification strategies today: continue to work on maintaining a reduced calorie state, getting the recommended amount of protein, incorporating whole foods, making healthy choices, staying well hydrated and practicing mindfulness when eating..  Additional education and resources provided today: None  Recommended Physical Activity Goals  Dorrie has been advised to work up to 150 minutes of moderate intensity aerobic activity a week and strengthening exercises 2-3 times per week for cardiovascular health, weight loss maintenance and preservation of muscle mass.   She has agreed to :  Think about enjoyable ways to increase daily physical activity and overcoming barriers to exercise and Increase physical activity in their day and reduce sedentary time (increase NEAT).  Medical Interventions and Pharmacotherapy  We discussed various medication options to help Jyla with her weight loss efforts and we both agreed to : Start metformin XR 500 mg daily  Associated Conditions Impacted  by Obesity Treatment  Assessment & Plan Insulin  resistance Insulin  resistance is indicated by elevated insulin  levels, contributing to weight gain and fatigue. Dietary modifications focusing on low carbohydrate intake are recommended to  manage insulin  levels. - Advise on maintaining a low carbohydrate diet with more fruits, vegetables, and whole grains. - Avoid sugary drinks and high-carb foods. - Start metformin XR 500 mg 1 tablet daily for pharmacoprophylaxis Essential (primary) hypertension Blood pressure has improved repeat blood pressure was 132/83.  She is currently on amlodipine  2.5 mg once a day and will continue medication.  Losing 10% of body weight may improve blood pressure control. Class 2 obesity due to excess calories without serious comorbidity with body mass index (BMI) of 37.0 to 37.9 in adult She is undergoing medical weight management and has lost 11 pounds since April. Currently on a 1200 calorie diet, her weight loss has slowed. Her metabolic rate is approximately 1500 calories, requiring a calorie deficit closer to 1000 calories for a 1-pound weekly weight loss. Genetic factors, particularly in minority groups, impact metabolic rate and weight loss, necessitating a higher calorie deficit. Options include increasing physical activity, incorporating meal replacements, or intermittent fasting. Metformin is considered to help with insulin  resistance and weight management. Weight management injections like Wegovy or Zepbound are also discussed as potential options if covered by insurance. - Start metformin 1 tablet daily to help with insulin  resistance and weight management. - Consider intermittent fasting with an 8-hour eating window to help control calorie intake. - Check with insurance for coverage of weight management injections like Wegovy or Zepbound. - Consider using meal replacements for difficult meals to manage calorie intake. - Increase physical activity to help achieve a 1000 calorie deficit.` Suspected sleep apnea She has hypertension and had a high Epworth at initial intake.  Since she made dietary changes she feels she is sleeping better and has more energy.  Her neck circumference is 15.5 inches and  she has a Mallampati of 3.  Suspected sleep apnea due to high sleepiness scale and narrow upper airway. She reports snoring that wakes her up. Weight loss may improve symptoms, and current energy levels have improved, suggesting possible improvement in sleep quality. - Consider sleep study if symptoms persist or worsen. - Encourage weight loss to improve symptoms.          Objective   Physical Exam:  Blood pressure 134/89, pulse 74, temperature 98.9 F (37.2 C), height 5' 1 (1.549 m), weight 192 lb (87.1 kg), SpO2 98%. Body mass index is 36.28 kg/m.  General: She is overweight, cooperative, alert, well developed, and in no acute distress. PSYCH: Has normal mood, affect and thought process.   HEENT: EOMI, sclerae are anicteric. Lungs: Normal breathing effort, no conversational dyspnea. Extremities: No edema.  Neurologic: No gross sensory or motor deficits. No tremors or fasciculations noted.    Diagnostic Data Reviewed:  BMET    Component Value Date/Time   NA 139 11/16/2023 0950   K 4.1 11/16/2023 0950   CL 104 11/16/2023 0950   CO2 21 11/16/2023 0950   GLUCOSE 85 11/16/2023 0950   GLUCOSE 104 (H) 04/15/2023 1242   BUN 10 11/16/2023 0950   CREATININE 0.80 11/16/2023 0950   CALCIUM 9.3 11/16/2023 0950   GFRNONAA >60 11/11/2022 0400   GFRAA >90 09/09/2011 1545   Lab Results  Component Value Date   HGBA1C 5.5 11/16/2023   Lab Results  Component Value Date   INSULIN  21.4 11/16/2023   Lab Results  Component Value Date   TSH 4.330 11/16/2023   CBC    Component Value Date/Time   WBC 7.6 11/16/2023 0950   WBC 7.2 04/15/2023 1242   RBC 4.82 11/16/2023 0950   RBC 5.19 (H) 04/15/2023 1242   HGB 10.0 (L) 11/16/2023 0950   HCT 34.7 11/16/2023 0950   PLT 494 (H) 11/16/2023 0950   MCV 72 (L) 11/16/2023 0950   MCH 20.7 (L) 11/16/2023 0950   MCH 22.6 (L) 11/11/2022 0400   MCHC 28.8 (L) 11/16/2023 0950   MCHC 30.2 04/15/2023 1242   RDW 17.5 (H) 11/16/2023 0950    Iron Studies    Component Value Date/Time   IRON 24 (L) 11/19/2022 1222   TIBC 368.2 11/19/2022 1222   FERRITIN 23.3 11/19/2022 1222   IRONPCTSAT 6.5 (L) 11/19/2022 1222   Lipid Panel     Component Value Date/Time   CHOL 178 11/16/2023 0950   TRIG 118 11/16/2023 0950   HDL 59 11/16/2023 0950   LDLCALC 98 11/16/2023 0950   Hepatic Function Panel     Component Value Date/Time   PROT 6.7 11/16/2023 0950   ALBUMIN 4.3 11/16/2023 0950   AST 21 11/16/2023 0950   ALT 25 11/16/2023 0950   ALKPHOS 60 11/16/2023 0950   BILITOT <0.2 11/16/2023 0950      Component Value Date/Time   TSH 4.330 11/16/2023 0950   Nutritional Lab Results  Component Value Date   VD25OH 6.1 (L) 11/16/2023    Medications: Outpatient Encounter Medications as of 01/27/2024  Medication Sig   amLODipine  (NORVASC ) 2.5 MG tablet Take 1 tablet (2.5 mg total) by mouth daily.   dicyclomine  (BENTYL ) 20 MG tablet TAKE 1 TABLET (20 MG TOTAL) BY MOUTH 3 (THREE) TIMES DAILY AS NEEDED FOR SPASMS.   Ferrous Sulfate (IRON PO) Take by mouth. Unknown dosage, two capsules daily   levonorgestrel (MIRENA) 20 MCG/DAY IUD 1 each by Intrauterine route once.   metFORMIN (GLUCOPHAGE-XR) 500 MG 24 hr tablet Take 1 tablet (500 mg total) by mouth daily with breakfast.   Vitamin D , Ergocalciferol , (DRISDOL ) 1.25 MG (50000 UNIT) CAPS capsule Take 1 capsule (50,000 Units total) by mouth every 7 (seven) days.   [DISCONTINUED] ferrous sulfate 325 (65 FE) MG EC tablet Take by mouth 2 (two) times daily.   No facility-administered encounter medications on file as of 01/27/2024.     Follow-Up   Return in about 4 weeks (around 02/24/2024) for For Weight Mangement with Dr. Francyne.SABRA She was informed of the importance of frequent follow up visits to maximize her success with intensive lifestyle modifications for her multiple health conditions.  Attestation Statement   Reviewed by clinician on day of visit: allergies, medications, problem  list, medical history, surgical history, family history, social history, and previous encounter notes.     Lucas Francyne, MD

## 2024-02-02 ENCOUNTER — Telehealth (INDEPENDENT_AMBULATORY_CARE_PROVIDER_SITE_OTHER): Payer: Self-pay | Admitting: Internal Medicine

## 2024-02-02 NOTE — Telephone Encounter (Signed)
 7/8 pts doc for work sent to wrong fax number, the correct fax is [phone]. Due by Thursauday 7/10 said she will have to start over

## 2024-02-17 ENCOUNTER — Emergency Department (HOSPITAL_BASED_OUTPATIENT_CLINIC_OR_DEPARTMENT_OTHER)
Admission: EM | Admit: 2024-02-17 | Discharge: 2024-02-17 | Disposition: A | Discharge status: Home / Self Care | Attending: Emergency Medicine | Admitting: Emergency Medicine

## 2024-02-17 ENCOUNTER — Other Ambulatory Visit: Payer: Self-pay

## 2024-02-17 ENCOUNTER — Emergency Department (HOSPITAL_BASED_OUTPATIENT_CLINIC_OR_DEPARTMENT_OTHER)

## 2024-02-17 DIAGNOSIS — D649 Anemia, unspecified: Secondary | ICD-10-CM | POA: Insufficient documentation

## 2024-02-17 DIAGNOSIS — R509 Fever, unspecified: Secondary | ICD-10-CM | POA: Insufficient documentation

## 2024-02-17 DIAGNOSIS — M5441 Lumbago with sciatica, right side: Secondary | ICD-10-CM | POA: Insufficient documentation

## 2024-02-17 DIAGNOSIS — M5431 Sciatica, right side: Secondary | ICD-10-CM

## 2024-02-17 LAB — SEDIMENTATION RATE: Sed Rate: 52 mm/h — ABNORMAL HIGH (ref 0–22)

## 2024-02-17 LAB — CBC WITH DIFFERENTIAL/PLATELET
Abs Immature Granulocytes: 0.03 K/uL (ref 0.00–0.07)
Basophils Absolute: 0 K/uL (ref 0.0–0.1)
Basophils Relative: 1 %
Eosinophils Absolute: 0 K/uL (ref 0.0–0.5)
Eosinophils Relative: 0 %
HCT: 31.1 % — ABNORMAL LOW (ref 36.0–46.0)
Hemoglobin: 9.6 g/dL — ABNORMAL LOW (ref 12.0–15.0)
Immature Granulocytes: 1 %
Lymphocytes Relative: 14 %
Lymphs Abs: 0.9 K/uL (ref 0.7–4.0)
MCH: 20.7 pg — ABNORMAL LOW (ref 26.0–34.0)
MCHC: 30.9 g/dL (ref 30.0–36.0)
MCV: 67 fL — ABNORMAL LOW (ref 80.0–100.0)
Monocytes Absolute: 0.2 K/uL (ref 0.1–1.0)
Monocytes Relative: 3 %
Neutro Abs: 5.3 K/uL (ref 1.7–7.7)
Neutrophils Relative %: 81 %
Platelets: 355 K/uL (ref 150–400)
RBC: 4.64 MIL/uL (ref 3.87–5.11)
RDW: 17.9 % — ABNORMAL HIGH (ref 11.5–15.5)
WBC: 6.5 K/uL (ref 4.0–10.5)
nRBC: 0 % (ref 0.0–0.2)

## 2024-02-17 LAB — RESP PANEL BY RT-PCR (RSV, FLU A&B, COVID)  RVPGX2
Influenza A by PCR: NEGATIVE
Influenza B by PCR: NEGATIVE
Resp Syncytial Virus by PCR: NEGATIVE
SARS Coronavirus 2 by RT PCR: NEGATIVE

## 2024-02-17 LAB — URINALYSIS, MICROSCOPIC (REFLEX)

## 2024-02-17 LAB — COMPREHENSIVE METABOLIC PANEL WITH GFR
ALT: 18 U/L (ref 0–44)
AST: 28 U/L (ref 15–41)
Albumin: 3.9 g/dL (ref 3.5–5.0)
Alkaline Phosphatase: 47 U/L (ref 38–126)
Anion gap: 12 (ref 5–15)
BUN: 13 mg/dL (ref 6–20)
CO2: 21 mmol/L — ABNORMAL LOW (ref 22–32)
Calcium: 8.8 mg/dL — ABNORMAL LOW (ref 8.9–10.3)
Chloride: 104 mmol/L (ref 98–111)
Creatinine, Ser: 0.83 mg/dL (ref 0.44–1.00)
GFR, Estimated: >60 mL/min (ref >60)
Glucose, Bld: 112 mg/dL — ABNORMAL HIGH (ref 70–99)
Potassium: 3.5 mmol/L (ref 3.5–5.1)
Sodium: 137 mmol/L (ref 135–145)
Total Bilirubin: 0.2 mg/dL (ref 0.0–1.2)
Total Protein: 7.1 g/dL (ref 6.5–8.1)

## 2024-02-17 LAB — URINALYSIS, ROUTINE W REFLEX MICROSCOPIC
Bilirubin Urine: NEGATIVE
Glucose, UA: NEGATIVE mg/dL
Ketones, ur: 15 mg/dL — AB
Nitrite: NEGATIVE
Protein, ur: 30 mg/dL — AB
Specific Gravity, Urine: 1.015 (ref 1.005–1.030)
pH: 6.5 (ref 5.0–8.0)

## 2024-02-17 LAB — LACTIC ACID, PLASMA: Lactic Acid, Venous: 0.9 mmol/L (ref 0.5–1.9)

## 2024-02-17 LAB — C-REACTIVE PROTEIN: CRP: 10.5 mg/dL — ABNORMAL HIGH (ref <1.0)

## 2024-02-17 LAB — PREGNANCY, URINE: Preg Test, Ur: NEGATIVE

## 2024-02-17 MED ORDER — SODIUM CHLORIDE 0.9 % IV BOLUS
1000.0000 mL | Freq: Once | INTRAVENOUS | Status: AC
  Administered 2024-02-17: 1000 mL via INTRAVENOUS

## 2024-02-17 MED ORDER — METHOCARBAMOL 500 MG PO TABS: 500.0000 mg | ORAL_TABLET | Freq: Three times a day (TID) | ORAL | 0 refills | Status: DC | PRN

## 2024-02-17 MED ORDER — IBUPROFEN 800 MG PO TABS: 800.0000 mg | ORAL_TABLET | Freq: Three times a day (TID) | ORAL | 0 refills | Status: DC | PRN

## 2024-02-17 MED ORDER — FENTANYL CITRATE PF 50 MCG/ML IJ SOSY
50.0000 ug | PREFILLED_SYRINGE | Freq: Once | INTRAMUSCULAR | Status: AC
  Administered 2024-02-17: 50 ug via INTRAVENOUS
  Filled 2024-02-17: qty 1

## 2024-02-17 MED ORDER — OXYCODONE-ACETAMINOPHEN 5-325 MG PO TABS: 1.0000 | ORAL_TABLET | Freq: Four times a day (QID) | ORAL | 0 refills | Status: DC | PRN

## 2024-02-17 MED ORDER — GADOBUTROL 1 MMOL/ML IV SOLN
8.7000 mL | Freq: Once | INTRAVENOUS | Status: AC | PRN
  Administered 2024-02-17: 8.7 mL via INTRAVENOUS

## 2024-02-17 NOTE — ED Provider Notes (Signed)
 [facility] Provider Note   CSN: [id number] Arrival date & time: 02/17/24  9351     Patient presents with: Sciatica   Laura Mejia is a 38 y.o. female.  She is here with a complaint of low back pain that started 5 days ago.  Radiating mostly down her right leg to about her knee.  Worse with movement.  No known trauma.  She started on meloxicam and Lidoderm patches, improvement.  Yesterday started with a fever up to 103.  Today woke up sweaty with continued fever and so presented here for further evaluation.  No sick contacts or recent travel.  No headache sore throat cough abdominal pain vomiting diarrhea or urinary symptoms.,  No problems with it.  No vaginal bleeding or discharge.  No numbness or weakness   The history is provided by the patient.  Back Pain Location:  Lumbar spine Quality:  Aching Radiates to:  R posterior upper leg Pain severity:  Moderate Onset quality:  Gradual Duration:  5 days Timing:  Constant Progression:  Unchanged Chronicity:  New Context: not recent injury   Relieved by:  Nothing Worsened by:  Ambulation and bending Ineffective treatments:  NSAIDs Associated symptoms: fever and leg pain   Associated symptoms: no abdominal pain, no bladder incontinence, no chest pain, no dysuria, no numbness and no weakness        Prior to Admission medications   Medication Sig Start Date End Date Taking? Authorizing Provider  amLODipine  (NORVASC ) 2.5 MG tablet Take 1 tablet (2.5 mg total) by mouth daily. 11/30/23   Francyne Romano, MD  dicyclomine  (BENTYL ) 20 MG tablet TAKE 1 TABLET (20 MG TOTAL) BY MOUTH 3 (THREE) TIMES DAILY AS NEEDED FOR SPASMS. 12/22/23   Craig Alan SAUNDERS, PA-C  Ferrous Sulfate (IRON PO) Take by mouth. Unknown dosage, two capsules daily    [provider]  levonorgestrel (MIRENA) 20 MCG/DAY IUD 1 each by Intrauterine route once.    [provider]  metFORMIN  (GLUCOPHAGE -XR)  500 MG 24 hr tablet Take 1 tablet (500 mg total) by mouth daily with breakfast. 01/27/24   Francyne Romano, MD  Vitamin D , Ergocalciferol , (DRISDOL ) 1.25 MG (50000 UNIT) CAPS capsule Take 1 capsule (50,000 Units total) by mouth every 7 (seven) days. 11/30/23   Francyne Romano, MD    Allergies: Patient has no known allergies.    Review of Systems  Constitutional:  Positive for fever.  Respiratory:  Negative for shortness of breath.   Cardiovascular:  Negative for chest pain.  Gastrointestinal:  Negative for abdominal pain, diarrhea, nausea and vomiting.  Genitourinary:  Negative for bladder incontinence, dysuria, vaginal bleeding and vaginal discharge.  Musculoskeletal:  Positive for back pain.  Neurological:  Negative for weakness and numbness.    Updated Vital Signs BP (!) 145/83   Pulse 100   Temp 100 F (37.8 C) (Oral)   Resp 18   Ht 5' 1 (1.549 m)   Wt 87.1 kg   SpO2 95%   BMI 36.28 kg/m   Physical Exam Vitals and nursing note reviewed.  Constitutional:      General: She is not in acute distress.    Appearance: Normal appearance. She is well-developed.  HENT:     Head: Normocephalic and atraumatic.  Eyes:     Conjunctiva/sclera: Conjunctivae normal.  Cardiovascular:     Rate and Rhythm: Normal rate and regular rhythm.     Heart sounds: No murmur heard. Pulmonary:  Effort: Pulmonary effort is normal. No respiratory distress.     Breath sounds: Normal breath sounds. No stridor. No wheezing.  Abdominal:     Palpations: Abdomen is soft.     Tenderness: There is no abdominal tenderness. There is no guarding or rebound.  Musculoskeletal:        General: No tenderness or deformity. Normal range of motion.     Cervical back: Neck supple.  Skin:    General: Skin is warm and dry.  Neurological:     General: No focal deficit present.     Mental Status: She is alert.     GCS: GCS eye subscore is 4. GCS verbal subscore is 5. GCS motor subscore is 6.     (all  labs ordered are listed, but only abnormal results are displayed) Labs Reviewed  COMPREHENSIVE METABOLIC PANEL WITH GFR - Abnormal; Notable for the following components:      Result Value   CO2 21 (*)    Glucose, Bld 112 (*)    Calcium 8.8 (*)    All other components within normal limits  CBC WITH DIFFERENTIAL/PLATELET - Abnormal; Notable for the following components:   Hemoglobin 9.6 (*)    HCT 31.1 (*)    MCV 67.0 (*)    MCH 20.7 (*)    RDW 17.9 (*)    All other components within normal limits  URINALYSIS, ROUTINE W REFLEX MICROSCOPIC - Abnormal; Notable for the following components:   APPearance HAZY (*)    Hgb urine dipstick LARGE (*)    Ketones, ur 15 (*)    Protein, ur 30 (*)    Leukocytes,Ua TRACE (*)    All other components within normal limits  SEDIMENTATION RATE - Abnormal; Notable for the following components:   Sed Rate 52 (*)    All other components within normal limits  C-REACTIVE PROTEIN - Abnormal; Notable for the following components:   CRP 10.5 (*)    All other components within normal limits  URINALYSIS, MICROSCOPIC (REFLEX) - Abnormal; Notable for the following components:   Bacteria, UA FEW (*)    All other components within normal limits  RESP PANEL BY RT-PCR (RSV, FLU A&B, COVID)  RVPGX2  CULTURE, BLOOD (ROUTINE X 2)  CULTURE, BLOOD (ROUTINE X 2)  URINE CULTURE  PREGNANCY, URINE  LACTIC ACID, PLASMA    EKG: None  Radiology: MR Lumbar Spine W Wo Contrast Result Date: 02/17/2024 CLINICAL DATA:  Provided history: Lumbar radiculopathy, infection suspected, positive x-ray/CT. Additional history provided: Severe pain. EXAM: MRI LUMBAR SPINE WITHOUT AND WITH CONTRAST TECHNIQUE: Multiplanar and multiecho pulse sequences of the lumbar spine were obtained without and with intravenous contrast. CONTRAST:  8.7mL GADAVIST  GADOBUTROL  1 MMOL/ML IV SOLN COMPARISON:  CT abdomen/pelvis 11/11/2022. FINDINGS: Segmentation: There are 6 non-rib-bearing lumbar type  vertebral bodies. For the purposes of this dictation, the lowest well-formed intervertebral disc space is designated L5-S1 and ribs are presumed absent at the T12 level. Alignment:  No significant spondylolisthesis. Vertebrae: No lumbar vertebral compression fracture. No significant marrow edema or focal worrisome marrow lesion. Specifically, there are no findings to suggest discitis/osteomyelitis within the lumbar spine. Conus medullaris and cauda equina: Conus extends to the L1 level. No signal abnormality identified within the visualized distal spinal cord. No pathologic enhancement of the visualized distal spinal cord or cauda equina nerve roots. Paraspinal and other soft tissues: No acute finding within included portions of the abdomen/retroperitoneum. No paraspinal mass or collection. Nonspecific edema signal within the dorsal subcutaneous  fat overlying the lumbar spine. Disc levels: Mild disc degeneration at L4-L5 and L5-S1. T12-L1: This level is imaged in the sagittal plane only. No significant disc herniation or stenosis. L1-L2: No significant disc herniation or stenosis. L2-L3: No significant disc herniation or stenosis. L3-L4: No significant disc herniation or stenosis. L4-L5: Central posterior annular fissure. Disc bulge. The disc protrusion results in mild-to-moderate bilateral subarticular narrowing (crowding the descending L5 nerve roots) (series 8, image 21). No significant central canal stenosis or neural foraminal narrowing. L5-S1: Small central disc protrusion (at site of posterior annular fissure). No significant spinal canal or foraminal stenosis. IMPRESSION: 1. No evidence of lumbar discitis/osteomyelitis. 2. At L4-L5, there is mild disc degeneration. Central posterior annular fissure. A disc bulge results in mild-to-moderate bilateral subarticular narrowing (crowding the descending L5 nerve roots). No significant central canal or neural foraminal stenosis. 3. At L5-S1, there is mild disc  degeneration. Small central disc protrusion (at site of posterior annular fissure). No significant spinal canal or foraminal stenosis. Electronically Signed   By: Rockey Childs D.O.   On: 02/17/2024 11:58     Procedures   Medications Ordered in the ED  sodium chloride  0.9 % bolus 1,000 mL (0 mLs Intravenous Stopped 02/17/24 1001)  fentaNYL  (SUBLIMAZE ) injection 50 mcg (50 mcg Intravenous Given 02/17/24 1008)  gadobutrol  (GADAVIST ) 1 MMOL/ML injection 8.7 mL (8.7 mLs Intravenous Contrast Given 02/17/24 1112)    Clinical Course as of 02/17/24 1629  Wed Feb 17, 2024  0945 Patient's sed rate is 50 although interestingly she has had sed rates in the past that have been been higher.  They were ordered by GI when they were working up her anemia.  [MB]  1205 Patient's MRI shows some disc disease but no evidence of discitis.  Reviewed with patient.  Will trial her on some pain medication short-term and she has a PCP to follow-up with.  Will allow PCP to decide whether they want to put her on steroids. [MB]    Clinical Course User Index [MB] Towana Ozell BROCKS, MD                                 Medical Decision Making Amount and/or Complexity of Data Reviewed Labs: ordered. Radiology: ordered.  Risk Prescription drug management.   This patient complains of radicular back pain fever; this involves an extensive number of treatment Options and is a complaint that carries with it a high risk of complications and morbidity. The differential includes musculoskeletal pain, radiculopathy, discitis, abscess, UTI, viral syndrome  I ordered, reviewed and interpreted labs, which included CBC with normal white count low hemoglobin, chemistries with mildly low bicarb, urinalysis with some hematuria which patient states is baseline, sed rate elevated CRP elevated, blood cultures and urine culture sent I ordered medication IV fluids IV pain medicine and reviewed PMP when indicated. I ordered imaging studies  which included MRI lumbar spine and I independently    visualized and interpreted imaging which showed mild to moderate disc disease no abscess or discitis Previous records obtained and reviewed in epic, has had prior elevated ESR's Cardiac monitoring reviewed, sinus rhythm Social determinants considered, no significant barriers Critical Interventions: None  After the interventions stated above, I reevaluated the patient and found patient's pain to be improved and neuro intact Admission and further testing considered, no indications for admission.  Will trial her on some pain medicine and muscle relaxants.  Will hold off  on steroids as he is on metformin  and possibly borderline diabetic.  Recommend close follow-up with PCP.  Return instructions discussed      Final diagnoses:  Sciatica of right side  Fever in adult    ED Discharge Orders          Ordered    oxyCODONE -acetaminophen  (PERCOCET/ROXICET) 5-325 MG tablet  Every 6 hours PRN        02/17/24 1207    methocarbamol  (ROBAXIN ) 500 MG tablet  Every 8 hours PRN        02/17/24 1207    ibuprofen  (ADVIL ) 800 MG tablet  Every 8 hours PRN        02/17/24 1207               Towana Ozell BROCKS, MD 02/17/24 [phone]

## 2024-02-17 NOTE — ED Triage Notes (Signed)
 Pt POV d/t back pain that starts In the middle back and radiates to the right leg.  Pt took 1 Meloxicam at 0530 but has not worked yet.   Pt also states she had a fever at home of 103.

## 2024-02-17 NOTE — Discharge Instructions (Addendum)
 Please rest and drink plenty of fluids.  Monitor for signs of fever.  Medication for inflammation pain and spasm.  Contact your primary care doctor for close follow-up.  Return instructions discussed.  Included below is the findings of the MRI.  IMPRESSION:  1. No evidence of lumbar discitis/osteomyelitis.  2. At L4-L5, there is mild disc degeneration. Central posterior  annular fissure. A disc bulge results in mild-to-moderate bilateral  subarticular narrowing (crowding the descending L5 nerve roots). No  significant central canal or neural foraminal stenosis.  3. At L5-S1, there is mild disc degeneration. Small central disc  protrusion (at site of posterior annular fissure). No significant  spinal canal or foraminal stenosis.

## 2024-02-18 LAB — URINE CULTURE: Culture: 10000 — AB

## 2024-02-19 ENCOUNTER — Telehealth (HOSPITAL_BASED_OUTPATIENT_CLINIC_OR_DEPARTMENT_OTHER): Payer: Self-pay

## 2024-02-19 NOTE — Telephone Encounter (Signed)
 Post ED Visit - Positive Culture Follow-up  Culture report reviewed by antimicrobial stewardship pharmacist: [facility] []  Rankin Dee, Pharm.D. []  Venetia Gully, Pharm.D., BCPS AQ-ID []  Garrel Crews, Pharm.D., BCPS []  Almarie Lunger, [address].D., BCPS []  [place], [address].D., BCPS, AAHIVP []  Rosaline Bihari, Pharm.D., BCPS, AAHIVP [x]  Vernell Meier, PharmD, BCPS []  Latanya Hint, PharmD, BCPS []  Donald Medley, PharmD, BCPS []  Rocky Bold, PharmD []  Dorothyann Alert, PharmD, BCPS []  Morene Babe, PharmD  [facility] []  Rosaline Edison, PharmD []  Romona Bliss, PharmD []  Dolphus Roller, PharmD []  Veva Seip, Rph []  Vernell Daunt) Leonce, PharmD []  Eva Allis, PharmD []  Rosaline Millet, PharmD []  Iantha Batch, PharmD []  Arvin Gauss, PharmD []  Wanda Hasting, PharmD []  Ronal Rav, PharmD []  Rocky Slade, PharmD []  Bard Jeans, PharmD   Positive urine culture Pt c/o back pain, sciatica and fever. Tmax 100, WBC WNL, UA - trace leuk, few bacteria's. No treatment needed and no further patient follow-up is required at this time.  Ruth Camelia Elbe 02/19/2024, 11:53 AM

## 2024-02-22 ENCOUNTER — Other Ambulatory Visit (INDEPENDENT_AMBULATORY_CARE_PROVIDER_SITE_OTHER): Payer: Self-pay | Admitting: Internal Medicine

## 2024-02-22 DIAGNOSIS — E88819 Insulin resistance, unspecified: Secondary | ICD-10-CM

## 2024-02-22 LAB — CULTURE, BLOOD (ROUTINE X 2)
Culture: NO GROWTH
Culture: NO GROWTH
Special Requests: ADEQUATE

## 2024-03-01 ENCOUNTER — Ambulatory Visit (INDEPENDENT_AMBULATORY_CARE_PROVIDER_SITE_OTHER): Admitting: Internal Medicine

## 2024-03-11 ENCOUNTER — Emergency Department (HOSPITAL_COMMUNITY)

## 2024-03-11 ENCOUNTER — Other Ambulatory Visit: Payer: Self-pay

## 2024-03-11 ENCOUNTER — Encounter (HOSPITAL_COMMUNITY): Payer: Self-pay

## 2024-03-11 ENCOUNTER — Emergency Department (HOSPITAL_COMMUNITY)
Admission: EM | Admit: 2024-03-11 | Discharge: 2024-03-12 | Disposition: A | Discharge status: Home / Self Care | Attending: Emergency Medicine | Admitting: Emergency Medicine

## 2024-03-11 DIAGNOSIS — D72829 Elevated white blood cell count, unspecified: Secondary | ICD-10-CM | POA: Insufficient documentation

## 2024-03-11 DIAGNOSIS — Z79899 Other long term (current) drug therapy: Secondary | ICD-10-CM | POA: Insufficient documentation

## 2024-03-11 DIAGNOSIS — I10 Essential (primary) hypertension: Secondary | ICD-10-CM | POA: Diagnosis not present

## 2024-03-11 DIAGNOSIS — M25562 Pain in left knee: Secondary | ICD-10-CM | POA: Diagnosis not present

## 2024-03-11 DIAGNOSIS — M62830 Muscle spasm of back: Secondary | ICD-10-CM

## 2024-03-11 DIAGNOSIS — Z794 Long term (current) use of insulin: Secondary | ICD-10-CM | POA: Diagnosis not present

## 2024-03-11 DIAGNOSIS — M545 Low back pain, unspecified: Secondary | ICD-10-CM | POA: Insufficient documentation

## 2024-03-11 DIAGNOSIS — M5410 Radiculopathy, site unspecified: Secondary | ICD-10-CM

## 2024-03-11 LAB — HCG, SERUM, QUALITATIVE: Preg, Serum: NEGATIVE

## 2024-03-11 LAB — COMPREHENSIVE METABOLIC PANEL WITH GFR
ALT: 18 U/L (ref 0–44)
AST: 22 U/L (ref 15–41)
Albumin: 3.3 g/dL — ABNORMAL LOW (ref 3.5–5.0)
Alkaline Phosphatase: 49 U/L (ref 38–126)
Anion gap: 8 (ref 5–15)
BUN: 16 mg/dL (ref 6–20)
CO2: 20 mmol/L — ABNORMAL LOW (ref 22–32)
Calcium: 8.3 mg/dL — ABNORMAL LOW (ref 8.9–10.3)
Chloride: 111 mmol/L (ref 98–111)
Creatinine, Ser: 0.75 mg/dL (ref 0.44–1.00)
GFR, Estimated: >60 mL/min (ref >60)
Glucose, Bld: 90 mg/dL (ref 70–99)
Potassium: 4.4 mmol/L (ref 3.5–5.1)
Sodium: 139 mmol/L (ref 135–145)
Total Bilirubin: 0.9 mg/dL (ref 0.0–1.2)
Total Protein: 6.9 g/dL (ref 6.5–8.1)

## 2024-03-11 LAB — CBC
HCT: 33.7 % — ABNORMAL LOW (ref 36.0–46.0)
Hemoglobin: 9.7 g/dL — ABNORMAL LOW (ref 12.0–15.0)
MCH: 20.3 pg — ABNORMAL LOW (ref 26.0–34.0)
MCHC: 28.8 g/dL — ABNORMAL LOW (ref 30.0–36.0)
MCV: 70.5 fL — ABNORMAL LOW (ref 80.0–100.0)
Platelets: 509 K/uL — ABNORMAL HIGH (ref 150–400)
RBC: 4.78 MIL/uL (ref 3.87–5.11)
RDW: 19.5 % — ABNORMAL HIGH (ref 11.5–15.5)
WBC: 12.5 K/uL — ABNORMAL HIGH (ref 4.0–10.5)
nRBC: 0 % (ref 0.0–0.2)

## 2024-03-11 MED ORDER — CYCLOBENZAPRINE HCL 10 MG PO TABS
10.0000 mg | ORAL_TABLET | Freq: Once | ORAL | Status: AC
  Administered 2024-03-11: 10 mg via ORAL
  Filled 2024-03-11: qty 1

## 2024-03-11 MED ORDER — DEXAMETHASONE SODIUM PHOSPHATE 10 MG/ML IJ SOLN
10.0000 mg | Freq: Once | INTRAMUSCULAR | Status: AC
  Administered 2024-03-11: 10 mg via INTRAVENOUS
  Filled 2024-03-11: qty 1

## 2024-03-11 MED ORDER — LIDOCAINE 5 % EX PTCH
1.0000 | MEDICATED_PATCH | CUTANEOUS | Status: DC
Start: 2024-03-11 — End: 2024-03-12
  Administered 2024-03-12: 1 via TRANSDERMAL
  Filled 2024-03-11: qty 1

## 2024-03-11 MED ORDER — HYDROMORPHONE HCL 1 MG/ML IJ SOLN
1.0000 mg | Freq: Once | INTRAMUSCULAR | Status: AC
  Administered 2024-03-11: 1 mg via INTRAVENOUS
  Filled 2024-03-11: qty 1

## 2024-03-11 MED ORDER — KETOROLAC TROMETHAMINE 15 MG/ML IJ SOLN
15.0000 mg | Freq: Once | INTRAMUSCULAR | Status: AC
  Administered 2024-03-11: 15 mg via INTRAVENOUS
  Filled 2024-03-11: qty 1

## 2024-03-11 MED ORDER — MORPHINE SULFATE (PF) 4 MG/ML IV SOLN
4.0000 mg | Freq: Once | INTRAVENOUS | Status: AC
  Administered 2024-03-11: 4 mg via INTRAVENOUS
  Filled 2024-03-11: qty 1

## 2024-03-11 NOTE — ED Provider Notes (Signed)
 [place] EMERGENCY DEPARTMENT AT [facility] Provider Note   CSN: [id number] Arrival date & time: 03/11/24  8151     Patient presents with: Back Pain and Fall   Laura Mejia is a 38 y.o. female.   The history is provided by the patient and medical records. No language interpreter was used.  Back Pain Fall     38 year old female with history of hypertension, iron deficiency anemia, insulin  resistance, GERD brought here via EMS from home for evaluation of a fall.  Patient report for the past month she has had persistent pain that started in her lower back and radiates down to her left hip and leg pain is moderate to severe, and worse with movement and with ambulation.  She mention she has been seen evaluate for condition several times recently.  She was treated with gabapentin, steroid, and pain medication.  Despite taking the medication his symptoms is not out of control.  Today while she was getting out of the shower she felt lightheadedness and fell down to the ground.  She denies loss of consciousness and denies striking her head.  She believes she hit her left knee and complaining of left knee pain with worsening lower back and leg pain.  She is requesting for pain medication.  No history of IV drug use active cancer.  She denies any fever chills bowel bladder incontinence without anesthesia denies any urinary discomfort.  She does report recurrent numbness to her left leg but that has been ongoing for the past month.  Prior to Admission medications   Medication Sig Start Date End Date Taking? Authorizing Provider  amLODipine  (NORVASC ) 2.5 MG tablet Take 1 tablet (2.5 mg total) by mouth daily. 11/30/23   Francyne Romano, MD  dicyclomine  (BENTYL ) 20 MG tablet TAKE 1 TABLET (20 MG TOTAL) BY MOUTH 3 (THREE) TIMES DAILY AS NEEDED FOR SPASMS. 12/22/23   Craig Alan SAUNDERS, PA-C  Ferrous Sulfate (IRON PO) Take by mouth. Unknown dosage, two capsules daily    [provider]  ibuprofen  (ADVIL ) 800 MG tablet Take 1 tablet (800 mg total) by mouth every 8 (eight) hours as needed. 02/17/24   Butler, Michael C, MD  levonorgestrel (MIRENA) 20 MCG/DAY IUD 1 each by Intrauterine route once.    [provider]  metFORMIN  (GLUCOPHAGE -XR) 500 MG 24 hr tablet Take 1 tablet (500 mg total) by mouth daily with breakfast. 01/27/24   Francyne Romano, MD  methocarbamol  (ROBAXIN ) 500 MG tablet Take 1 tablet (500 mg total) by mouth every 8 (eight) hours as needed for muscle spasms. 02/17/24   Towana Ozell BROCKS, MD  oxyCODONE -acetaminophen  (PERCOCET/ROXICET) 5-325 MG tablet Take 1 tablet by mouth every 6 (six) hours as needed for severe pain (pain score 7-10). 02/17/24   Towana Ozell BROCKS, MD  Vitamin D , Ergocalciferol , (DRISDOL ) 1.25 MG (50000 UNIT) CAPS capsule Take 1 capsule (50,000 Units total) by mouth every 7 (seven) days. 11/30/23   Francyne Romano, MD    Allergies: Patient has no known allergies.    Review of Systems  Musculoskeletal:  Positive for back pain.  All other systems reviewed and are negative.   Updated Vital Signs BP (!) 135/95   Pulse 86   Temp 99 F (37.2 C) (Oral)   Resp 20   SpO2 98%   Physical Exam Vitals and nursing note reviewed.  Constitutional:      General: She is not in acute distress.    Appearance: She is well-developed.  Comments: Patient laying in a right lateral decubitus position appears uncomfortable.  HENT:     Head: Normocephalic and atraumatic.  Eyes:     Conjunctiva/sclera: Conjunctivae normal.  Cardiovascular:     Rate and Rhythm: Normal rate and regular rhythm.     Pulses: Normal pulses.     Heart sounds: Normal heart sounds.  Pulmonary:     Effort: Pulmonary effort is normal.  Abdominal:     Palpations: Abdomen is soft.     Tenderness: There is no abdominal tenderness.  Musculoskeletal:        General: Tenderness (Tenderness to lumbar and left lumbosacral sacral region with positive straight  leg raise.  Able to flex and extend left knee.  DP pulse palpable) and signs of injury (Left knee: Tenderness about the knee without any bruising noted.  Able to flex and extend the knee.) present.     Cervical back: Neck supple.  Skin:    Findings: No rash.  Neurological:     Mental Status: She is alert.  Psychiatric:        Mood and Affect: Mood normal.     (all labs ordered are listed, but only abnormal results are displayed) Labs Reviewed  COMPREHENSIVE METABOLIC PANEL WITH GFR - Abnormal; Notable for the following components:      Result Value   CO2 20 (*)    Calcium 8.3 (*)    Albumin 3.3 (*)    All other components within normal limits  CBC - Abnormal; Notable for the following components:   WBC 12.5 (*)    Hemoglobin 9.7 (*)    HCT 33.7 (*)    MCV 70.5 (*)    MCH 20.3 (*)    MCHC 28.8 (*)    RDW 19.5 (*)    Platelets 509 (*)    All other components within normal limits  HCG, SERUM, QUALITATIVE  URINALYSIS, ROUTINE W REFLEX MICROSCOPIC  CBG MONITORING, ED    EKG: None  Radiology: DG Hip Unilat W or Wo Pelvis 2-3 Views Left Result Date: 03/11/2024 CLINICAL DATA:  Status post fall. EXAM: DG HIP (WITH OR WITHOUT PELVIS) 2-3V LEFT COMPARISON:  None Available. FINDINGS: There is no evidence of hip fracture or dislocation. There is no evidence of arthropathy or other focal bone abnormality. IMPRESSION: Negative. Electronically Signed   By: Suzen Dials M.D.   On: 03/11/2024 22:16   DG Knee Complete 4 Views Left Result Date: 03/11/2024 CLINICAL DATA:  Status post fall. EXAM: LEFT KNEE - COMPLETE 4+ VIEW COMPARISON:  None Available. FINDINGS: No evidence of fracture, dislocation, or joint effusion. No evidence of arthropathy or other focal bone abnormality. Soft tissues are unremarkable. IMPRESSION: Negative. Electronically Signed   By: Suzen Dials M.D.   On: 03/11/2024 22:15     Procedures   Medications Ordered in the ED  cyclobenzaprine  (FLEXERIL ) tablet  10 mg (has no administration in time range)  dexamethasone  (DECADRON ) injection 10 mg (has no administration in time range)  morphine  (PF) 4 MG/ML injection 4 mg (4 mg Intravenous Given 03/11/24 2018)  ketorolac  (TORADOL ) 15 MG/ML injection 15 mg (15 mg Intravenous Given 03/11/24 2132)  HYDROmorphone  (DILAUDID ) injection 1 mg (1 mg Intravenous Given 03/11/24 2249)                                    Medical Decision Making Amount and/or Complexity of Data Reviewed Labs: ordered. Radiology:  ordered.  Risk Prescription drug management.   BP (!) 135/95   Pulse 86   Temp 99 F (37.2 C) (Oral)   Resp 20   SpO2 98%   40:51 PM  38 year old female with history of hypertension, iron deficiency anemia, insulin  resistance, GERD brought here via EMS from home for evaluation of a fall.  Patient report for the past month she has had persistent pain that started in her lower back and radiates down to her left hip and leg pain is moderate to severe, and worse with movement and with ambulation.  She mention she has been seen evaluate for condition several times recently.  She was treated with gabapentin, steroid, and pain medication.  Despite taking the medication his symptoms is not out of control.  Today while she was getting out of the shower she felt lightheadedness and fell down to the ground.  She denies loss of consciousness and denies striking her head.  She believes she hit her left knee and complaining of left knee pain with worsening lower back and leg pain.  She is requesting for pain medication.  No history of IV drug use active cancer.  She denies any fever chills bowel bladder incontinence without anesthesia denies any urinary discomfort.  She does report recurrent numbness to her left leg but that has been ongoing for the past month.  Exam notable for tenderness to lumbar spine and left lumbar sacral region with positive left straight leg raise.  Exam of left leg with tenderness to palpation  but no deformity or bruising noted.  Patient is neurovascularly intact with equal strength to bilateral lower extremities.  She does not have any red flags.  Her symptom has been ongoing issue for the past month.  EMR reviewed patient was previously seen and evaluated in the ED on 02/17/2024 for her lower back pain that radiates to her left leg similar to today.  She had a fever at that time as well.  A workup which includes a lumbar spine MRI shows some mild disc disease with no evidence of discitis.  -Labs ordered, independently viewed and interpreted by me.  Labs remarkable for mild elevated white count of 12.5.  This is likely in the setting of recent steroid use.  Rectal temperature is 99.6 and patient denies having any infectious symptoms.  Urinalysis pending. -The patient was maintained on a cardiac monitor.  I personally viewed and interpreted the cardiac monitored which showed an underlying rhythm of: sinus tachycardia -Imaging independently viewed and interpreted by me and I agree with radiologist's interpretation.  Result remarkable for xray of L knee and L hip are negative -This patient presents to the ED for concern of leg pain, this involves an extensive number of treatment options, and is a complaint that carries with it a high risk of complications and morbidity.  The differential diagnosis includes sciatica, mechanical fall, strain, sprain, fx, dislocation, discitis, epidural abscess, UTI -Co morbidities that complicate the patient evaluation includes obesity, GERD, HTN -Treatment includes morphine , toradol , dilaudid , and flexeril  along with decadron  -Reevaluation of the patient after these medicines showed that the patient stayed the same -PCP office notes or outside notes reviewed -Discussion with oncoming provider who will reassess pt  -Escalation to admission/observation considered: dispo pending  To reiterate, patient has been dealing with back pain and radicular leg pain for  nearly a month and has had an MRI of her lumbar spine several weeks ago which did show evidence of some disc protrusion but  no osteomyelitis or discitis.  I have considered repeat MRI but I felt that it is low yield as patient does not have any red flags and she is able to move her legs and she is without any bowel bladder incontinence or saddle anesthesia to suggest cauda equina or osteomyelitis or discitis.  Mildly elevated white count is likely in the setting of recent steroid use.  She does not have a fever today.  Focus on symptom control but if patient is unable to ambulate despite aggressive pain management, may consider possible admission for pain management as well as PT OT.      Final diagnoses:  Radicular leg pain    ED Discharge Orders     None          Nivia Colon, PA-C 03/11/24 2325    Ruthe Cornet, DO 03/12/24 1501

## 2024-03-11 NOTE — ED Notes (Signed)
 Pt was unable to sit up to try to ambulate. Provider notified.

## 2024-03-11 NOTE — ED Triage Notes (Signed)
 BIBA from home for left side back, hip and knee pain. Pt was standing up using her cane when her legs got weak, causing her to fall to the floor. No LOC. Pt has a hx of lumbar disc degeneration. 100 mcg Fentanyl  given PTA. 148/66 bp 100 hr 119 cbg 100% r/a

## 2024-03-11 NOTE — ED Provider Notes (Signed)
 38 yo female with 1 month low back pain, radiates down right leg. Had fever 1 month ago with back pain presentation, had MRI at that time, treated with steroids with some relief.  Today, fell getting out of shower, hurt left hip, pain radiates down left leg. XR hip and knee negative.  Given Flexeril  and Dilaudid . Ambulatory with cane.  Rectal temp 99.6 WBC elevated, likely due to recent steroids.  Physical Exam  BP (!) 170/112   Pulse 100   Temp 99.6 F (37.6 C) (Rectal)   Resp 14   SpO2 98%   Physical Exam  Procedures  Procedures  ED Course / MDM    Medical Decision Making Amount and/or Complexity of Data Reviewed Labs: ordered. Radiology: ordered.  Risk Prescription drug management.   Patient still unable to attempt to ambulate due to severity of spasms in her back with movement. Patient states that she was here a month ago, told that she had degenerative disc disease and herniated disc, followed up with her primary care provider who referred her to orthopedics and is scheduled to be seen next Friday.  In the meantime, she has deteriorated at home, lying in bed and unable to get out of the bed without significant assistance and use of a cane due to pain and spasms.  Patient has not been given any sort of physical therapy exercises to work on at home and states that she just lays around all day and takes pain meds for her pain.  I think that this has contributed to patient's condition today.  She is deconditioned generally.  She has equal leg strength however with any movement in the bed reports severe spasm.  Plan is to attempt better control with Valium .  Will provide Solu-Medrol  with plan to hopefully discharge with prednisone  taper and instructions for home therapy.  Patient improved, able to sit/stand. Recommend taper off gabapentin (only has 4 tablets left), stop robaxin . Rx for prednisone  taper, Celebrex , tizanadine. Gentle home exercises. Encouraged light activity at  home, wean off of back brace.        Beverley Leita LABOR, PA-C 03/12/24 0234    Trine Raynell Moder, MD 03/12/24 [phone]

## 2024-03-12 MED ORDER — PREDNISONE 10 MG (21) PO TBPK: ORAL_TABLET | Freq: Every day | ORAL | 0 refills | Status: DC

## 2024-03-12 MED ORDER — METHYLPREDNISOLONE SODIUM SUCC 125 MG IJ SOLR
125.0000 mg | Freq: Once | INTRAMUSCULAR | Status: AC
  Administered 2024-03-12: 125 mg via INTRAVENOUS
  Filled 2024-03-12: qty 2

## 2024-03-12 MED ORDER — CELECOXIB 200 MG PO CAPS
200.0000 mg | ORAL_CAPSULE | Freq: Two times a day (BID) | ORAL | 0 refills | Status: AC
Start: 2024-03-12 — End: 2024-03-22

## 2024-03-12 MED ORDER — DIAZEPAM 5 MG PO TABS
5.0000 mg | ORAL_TABLET | Freq: Once | ORAL | Status: AC
  Administered 2024-03-12: 5 mg via ORAL
  Filled 2024-03-12: qty 1

## 2024-03-12 MED ORDER — TIZANIDINE HCL 2 MG PO CAPS: 2.0000 mg | ORAL_CAPSULE | Freq: Three times a day (TID) | ORAL | 0 refills | Status: AC | PRN

## 2024-03-12 NOTE — Discharge Instructions (Signed)
 Wean off of your gabapentin. STOP the Robaxin .  Take prednisone  daily as prescribed. Take tizanidine  as needed as prescribed for spasms, do not drive or operate machinery while taking this medication.  Take Celebrex  twice daily as prescribed.  Follow up with orthopedics as scheduled. Gentle exercises at home as discussed (heel slides, figure 4, cat/cow)

## 2024-03-12 NOTE — ED Notes (Signed)
 Attempting getting patient to use Bedside commode, but she is unable to get off the bed due to her spasms.

## 2024-03-18 ENCOUNTER — Encounter: Payer: Self-pay | Admitting: Physical Medicine and Rehabilitation

## 2024-03-18 ENCOUNTER — Ambulatory Visit (INDEPENDENT_AMBULATORY_CARE_PROVIDER_SITE_OTHER): Admitting: Physical Medicine and Rehabilitation

## 2024-03-18 DIAGNOSIS — M5416 Radiculopathy, lumbar region: Secondary | ICD-10-CM

## 2024-03-18 DIAGNOSIS — M51369 Other intervertebral disc degeneration, lumbar region without mention of lumbar back pain or lower extremity pain: Secondary | ICD-10-CM | POA: Diagnosis not present

## 2024-03-18 DIAGNOSIS — M48061 Spinal stenosis, lumbar region without neurogenic claudication: Secondary | ICD-10-CM | POA: Diagnosis not present

## 2024-03-18 DIAGNOSIS — M5442 Lumbago with sciatica, left side: Secondary | ICD-10-CM | POA: Diagnosis not present

## 2024-03-18 DIAGNOSIS — M5441 Lumbago with sciatica, right side: Secondary | ICD-10-CM

## 2024-03-18 MED ORDER — DIAZEPAM 5 MG PO TABS: ORAL_TABLET | ORAL | 0 refills | Status: DC

## 2024-03-18 NOTE — Progress Notes (Signed)
 Lower back pain  Started 02/13/24 10/10 Radiating down legs L>R Sharp pain  Has had numbness and tingling  No PT or injections Is doing exercises that the ED provider MRI- 02/17/24

## 2024-03-18 NOTE — Progress Notes (Signed)
 Laura Mejia - 38 y.o. female MRN [id number]  Date of birth: 1985-12-19  Office Visit Note: Visit Date: 03/18/2024 PCP: Patient, No Pcp Per Referred by: Laurice President, NP  Subjective: Chief Complaint  Patient presents with   Lower Back - Pain   HPI: Laura Mejia is a 38 y.o. female who comes in today per the request of President Laurice, NP for evaluation of acute bilateral lower back pain radiating down legs, worse on the left where her pain radiates to hip/buttock and down posterolateral leg to foot. Pain started on 02/13/2024 after walking. Her pain worsens with movement, activity and prolonged sitting. She describes her pain as dull, aching and spasm sensation, currently rates as 7 out of 10. Some relief of pain with home exercise regimen, rest and use of medications. She did try oral Prednisone , Gabapentin, Celebrex  and Zanaflex . States she stopped taking these medications as she experienced itching to hands and rash to feet. No history of formal physical therapy. She was evaluated in the emergency department on 03/11/2024 for same. Recent lumbar MRI imaging shows central posterior annular fissure at L4-L5, there is also mild-to-moderate bilateral subarticular narrowing (crowding the descending L5 nerve roots). No high grade spinal canal stenosis noted.        Review of Systems  Musculoskeletal:  Positive for back pain.  Neurological:  Negative for tingling, sensory change, focal weakness and weakness.  All other systems reviewed and are negative.  Otherwise per HPI.  Assessment & Plan: Visit Diagnoses:    ICD-10-CM   1. Acute bilateral low back pain with bilateral sciatica  M54.42 Ambulatory referral to Physical Medicine Rehab   M54.41     2. Lumbar radiculopathy  M54.16 Ambulatory referral to Physical Medicine Rehab    3. Stenosis of lateral recess of lumbar spine  M48.061 Ambulatory referral to Physical Medicine Rehab    4. Annular tear of lumbar disc  M51.369  Ambulatory referral to Physical Medicine Rehab       Plan: Findings:  Acute bilateral lower back pain radiating down legs, worse on the left where her pain radiates to hip/buttock and down posterolateral leg to foot. Patient continues to have severe pain despite good conservative therapies such as home exercise regimen, rest and use of medications. I discussed recent lumbar MRI with patient today using imaging and spine model. Patients clinical presentation and exam are consistent with lumbar radiculopathy, more of L5 nerve pattern. We discussed treatment plan in detail today. Next step is to perform diagnostic and hopefully therapeutic left L5-S1 interlaminar epidural steroid injection under fluoroscopic guidance. I discussed injection procedure in detail today, she has no questions. She does voice anxiety related to injection procedures, I did prescribe her pre-procedure Valium  for her to take on day of injection. No red flag symptoms noted upon exam today.     Meds & Orders:  Meds ordered this encounter  Medications   diazepam  (VALIUM ) 5 MG tablet    Sig: Take one tablet by mouth with light food one hour prior to procedure.    Dispense:  1 tablet    Refill:  0    Orders Placed This Encounter  Procedures   Ambulatory referral to Physical Medicine Rehab    Follow-up: Return for Left L5-S1 interlaminar epidural steroid injection.   Procedures: No procedures performed      Clinical History: CLINICAL DATA:  Provided history: Lumbar radiculopathy, infection suspected, positive x-ray/CT. Additional history provided: Severe pain.   EXAM: MRI LUMBAR SPINE  WITHOUT AND WITH CONTRAST   TECHNIQUE: Multiplanar and multiecho pulse sequences of the lumbar spine were obtained without and with intravenous contrast.   CONTRAST:  8.7mL GADAVIST  GADOBUTROL  1 MMOL/ML IV SOLN   COMPARISON:  CT abdomen/pelvis 11/11/2022.   FINDINGS: Segmentation: There are 6 non-rib-bearing lumbar type  vertebral bodies. For the purposes of this dictation, the lowest well-formed intervertebral disc space is designated L5-S1 and ribs are presumed absent at the T12 level.   Alignment:  No significant spondylolisthesis.   Vertebrae: No lumbar vertebral compression fracture. No significant marrow edema or focal worrisome marrow lesion. Specifically, there are no findings to suggest discitis/osteomyelitis within the lumbar spine.   Conus medullaris and cauda equina: Conus extends to the L1 level. No signal abnormality identified within the visualized distal spinal cord. No pathologic enhancement of the visualized distal spinal cord or cauda equina nerve roots.   Paraspinal and other soft tissues: No acute finding within included portions of the abdomen/retroperitoneum. No paraspinal mass or collection. Nonspecific edema signal within the dorsal subcutaneous fat overlying the lumbar spine.   Disc levels:   Mild disc degeneration at L4-L5 and L5-S1.   T12-L1: This level is imaged in the sagittal plane only. No significant disc herniation or stenosis.   L1-L2: No significant disc herniation or stenosis.   L2-L3: No significant disc herniation or stenosis.   L3-L4: No significant disc herniation or stenosis.   L4-L5: Central posterior annular fissure. Disc bulge. The disc protrusion results in mild-to-moderate bilateral subarticular narrowing (crowding the descending L5 nerve roots) (series 8, image 21). No significant central canal stenosis or neural foraminal narrowing.   L5-S1: Small central disc protrusion (at site of posterior annular fissure). No significant spinal canal or foraminal stenosis.   IMPRESSION: 1. No evidence of lumbar discitis/osteomyelitis. 2. At L4-L5, there is mild disc degeneration. Central posterior annular fissure. A disc bulge results in mild-to-moderate bilateral subarticular narrowing (crowding the descending L5 nerve roots). No significant  central canal or neural foraminal stenosis. 3. At L5-S1, there is mild disc degeneration. Small central disc protrusion (at site of posterior annular fissure). No significant spinal canal or foraminal stenosis.     Electronically Signed   By: Rockey Childs D.O.   On: 02/17/2024 11:58   She reports that she has never smoked. She has never used smokeless tobacco.  Recent Labs    11/16/23 0950  HGBA1C 5.5    Objective:  VS:  HT:    WT:   BMI:     BP:   HR: bpm  TEMP: ( )  RESP:  Physical Exam Vitals and nursing note reviewed.  HENT:     Head: Normocephalic and atraumatic.     Right Ear: External ear normal.     Left Ear: External ear normal.     Nose: Nose normal.     Mouth/Throat:     Mouth: Mucous membranes are moist.  Eyes:     Extraocular Movements: Extraocular movements intact.  Cardiovascular:     Rate and Rhythm: Normal rate.     Pulses: Normal pulses.  Pulmonary:     Effort: Pulmonary effort is normal.  Abdominal:     General: Abdomen is flat. There is no distension.  Musculoskeletal:        General: Tenderness present.     Cervical back: Normal range of motion.     Comments: Patient rises from seated position to standing without difficulty. Good lumbar range of motion. No pain noted with facet  loading. 5/5 strength noted with bilateral hip flexion, knee flexion/extension, ankle dorsiflexion/plantarflexion and EHL. No clonus noted bilaterally. No pain upon palpation of greater trochanters. No pain with internal/external rotation of bilateral hips. Sensation intact bilaterally. Dysesthesias noted to left L5 dermatome. Negative slump test bilaterally. Ambulates without aid, gait steady.     Skin:    General: Skin is warm and dry.     Capillary Refill: Capillary refill takes less than 2 seconds.  Neurological:     General: No focal deficit present.     Mental Status: She is alert and oriented to person, place, and time.  Psychiatric:        Mood and Affect: Mood  normal.        Behavior: Behavior normal.     Ortho Exam  Imaging: No results found.  Past Medical/Family/Surgical/Social History: Medications & Allergies reviewed per EMR, new medications updated. Patient Active Problem List   Diagnosis Date Noted   Iron deficiency anemia 11/30/2023   Vitamin D  deficiency 11/30/2023   Insulin  resistance 11/30/2023   Essential (primary) hypertension 11/16/2023   Class 2 obesity due to excess calories without serious comorbidity with body mass index (BMI) of 37.0 to 37.9 in adult 11/16/2023   Suspected sleep apnea 11/16/2023   Chest pain of uncertain etiology 04/10/2021   PAC (premature atrial contraction) 10/20/2011   Palpitations 09/22/2011   Murmur, cardiac 09/22/2011   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)    Depression    Diverticulitis    Gallbladder problem    GERD (gastroesophageal reflux disease)    Heart murmur    Palpitations    Vitamin D  deficiency    Family History  Problem Relation Age of Onset   Hypertension Mother    Stroke Mother    Depression Mother    Anxiety disorder Mother    Bipolar disorder Mother    Obesity Mother    Bone cancer Mother        10/2023 dx   Hypertension Father    Diabetes Father    Heart disease Father    Colon cancer Neg Hx    Rectal cancer Neg Hx    Stomach cancer Neg Hx    Esophageal cancer Neg Hx    Pancreatic cancer Neg Hx    Liver cancer Neg Hx    Past Surgical History:  Procedure Laterality Date   CESAREAN SECTION     CHOLECYSTECTOMY     Social History   Occupational History   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

## 2024-03-21 ENCOUNTER — Other Ambulatory Visit: Payer: Self-pay | Admitting: Physical Medicine and Rehabilitation

## 2024-03-21 ENCOUNTER — Encounter: Payer: Self-pay | Admitting: Physical Medicine and Rehabilitation

## 2024-03-21 MED ORDER — MELOXICAM 15 MG PO TABS
15.0000 mg | ORAL_TABLET | Freq: Every day | ORAL | 0 refills | Status: DC
Start: 2024-03-21 — End: 2024-04-25

## 2024-03-30 ENCOUNTER — Ambulatory Visit (INDEPENDENT_AMBULATORY_CARE_PROVIDER_SITE_OTHER): Admitting: Internal Medicine

## 2024-04-01 ENCOUNTER — Telehealth: Payer: Self-pay

## 2024-04-01 ENCOUNTER — Encounter: Payer: Self-pay | Admitting: Physical Medicine and Rehabilitation

## 2024-04-01 ENCOUNTER — Telehealth: Payer: Self-pay | Admitting: Physical Medicine and Rehabilitation

## 2024-04-01 DIAGNOSIS — F411 Generalized anxiety disorder: Secondary | ICD-10-CM

## 2024-04-01 NOTE — Telephone Encounter (Signed)
 Pre medication for procedure requested

## 2024-04-01 NOTE — Telephone Encounter (Signed)
 Patient is asking for FMLA. She states her PCP took her oow and needs continued by you. Please advise. Thank you!

## 2024-04-04 MED ORDER — TRIAZOLAM 0.25 MG PO TABS: ORAL_TABLET | ORAL | 0 refills | Status: DC

## 2024-04-04 NOTE — Addendum Note (Signed)
 Addended by: ELDONNA GARDINER POUR on: 04/04/2024 08:29 AM   Modules accepted: Orders

## 2024-04-04 NOTE — Telephone Encounter (Signed)
 Mychart sent to patient advised Dr. Eldonna will write her out until injection. Patient advised to bring forms,$20 payment and also to complete and sign auth.

## 2024-04-13 ENCOUNTER — Ambulatory Visit: Admitting: Physical Medicine and Rehabilitation

## 2024-04-13 ENCOUNTER — Other Ambulatory Visit: Payer: Self-pay

## 2024-04-13 VITALS — BP 140/84 | HR 96

## 2024-04-13 DIAGNOSIS — M5416 Radiculopathy, lumbar region: Secondary | ICD-10-CM | POA: Diagnosis not present

## 2024-04-13 MED ORDER — METHYLPREDNISOLONE ACETATE 80 MG/ML IJ SUSP
80.0000 mg | Freq: Once | INTRAMUSCULAR | Status: AC
  Administered 2024-04-13: 80 mg

## 2024-04-13 NOTE — Procedures (Signed)
 Lumbar Epidural Steroid Injection - Interlaminar Approach with Fluoroscopic Guidance  Patient: Laura Mejia      Date of Birth: 1985/11/12 MRN: [id number] PCP: Patient, No Pcp Per      Visit Date: 04/13/2024   Universal Protocol:     Consent Given By: the patient  Position: PRONE  Additional Comments: Vital signs were monitored before and after the procedure. Patient was prepped and draped in the usual sterile fashion. The correct patient, procedure, and site was verified.   Injection Procedure Details:   Procedure diagnoses: Lumbar radiculopathy [M54.16]   Meds Administered: No orders of the defined types were placed in this encounter.    Laterality: Left  Location/Site:  L5-S1  Needle: 3.5 in., 20 ga. Tuohy  Needle Placement: Paramedian epidural  Findings:   -Comments: Excellent flow of contrast into the epidural space.  Procedure Details: Using a paramedian approach from the side mentioned above, the region overlying the inferior lamina was localized under fluoroscopic visualization and the soft tissues overlying this structure were infiltrated with 4 ml. of 1% Lidocaine  without Epinephrine. The Tuohy needle was inserted into the epidural space using a paramedian approach.   The epidural space was localized using loss of resistance along with counter oblique bi-planar fluoroscopic views.  After negative aspirate for air, blood, and CSF, a 2 ml. volume of Isovue-250 was injected into the epidural space and the flow of contrast was observed. Radiographs were obtained for documentation purposes.    The injectate was administered into the level noted above.   Additional Comments:  The patient tolerated the procedure well Dressing: 2 x 2 sterile gauze and Band-Aid    Post-procedure details: Patient was observed during the procedure. Post-procedure instructions were reviewed.  Patient left the clinic in stable condition.

## 2024-04-13 NOTE — Progress Notes (Signed)
 Pain Scale   Average Pain 6 Patient advising she has chronic lower back pain radiating bilaterally to legs with pain on and off without warning        +Driver, -BT, -Dye Allergies.

## 2024-04-13 NOTE — Progress Notes (Signed)
 Laura Mejia - 38 y.o. female MRN [id number]  Date of birth: 1985-08-14  Office Visit Note: Visit Date: 04/13/2024 PCP: Patient, No Pcp Per Referred by: Trudy Duwaine BRAVO, NP  Subjective: Chief Complaint  Patient presents with   Lower Back - Pain   HPI:  Laura Mejia is a 38 y.o. female who comes in today at the request of Duwaine Trudy, FNP for planned Left L5-S1 Lumbar Interlaminar epidural steroid injection with fluoroscopic guidance.  The patient has failed conservative care including home exercise, medications, time and activity modification.  This injection will be diagnostic and hopefully therapeutic.  Please see requesting physician notes for further details and justification.   ROS Otherwise per HPI.  Assessment & Plan: Visit Diagnoses:    ICD-10-CM   1. Lumbar radiculopathy  M54.16 XR C-ARM NO REPORT    Epidural Steroid injection    methylPREDNISolone  acetate (DEPO-MEDROL ) injection 80 mg      Plan: No additional findings.   Meds & Orders:  Meds ordered this encounter  Medications   methylPREDNISolone  acetate (DEPO-MEDROL ) injection 80 mg    Orders Placed This Encounter  Procedures   XR C-ARM NO REPORT   Epidural Steroid injection    Follow-up: Return for visit to requesting provider as needed.   Procedures: No procedures performed  Lumbar Epidural Steroid Injection - Interlaminar Approach with Fluoroscopic Guidance  Patient: Laura Mejia      Date of Birth: 1986-01-19 MRN: [id number] PCP: Patient, No Pcp Per      Visit Date: 04/13/2024   Universal Protocol:     Consent Given By: the patient  Position: PRONE  Additional Comments: Vital signs were monitored before and after the procedure. Patient was prepped and draped in the usual sterile fashion. The correct patient, procedure, and site was verified.   Injection Procedure Details:   Procedure diagnoses: Lumbar radiculopathy [M54.16]   Meds Administered: No orders of the defined  types were placed in this encounter.    Laterality: Left  Location/Site:  L5-S1  Needle: 3.5 in., 20 ga. Tuohy  Needle Placement: Paramedian epidural  Findings:   -Comments: Excellent flow of contrast into the epidural space.  Procedure Details: Using a paramedian approach from the side mentioned above, the region overlying the inferior lamina was localized under fluoroscopic visualization and the soft tissues overlying this structure were infiltrated with 4 ml. of 1% Lidocaine  without Epinephrine. The Tuohy needle was inserted into the epidural space using a paramedian approach.   The epidural space was localized using loss of resistance along with counter oblique bi-planar fluoroscopic views.  After negative aspirate for air, blood, and CSF, a 2 ml. volume of Isovue-250 was injected into the epidural space and the flow of contrast was observed. Radiographs were obtained for documentation purposes.    The injectate was administered into the level noted above.   Additional Comments:  The patient tolerated the procedure well Dressing: 2 x 2 sterile gauze and Band-Aid    Post-procedure details: Patient was observed during the procedure. Post-procedure instructions were reviewed.  Patient left the clinic in stable condition.   Clinical History: CLINICAL DATA:  Provided history: Lumbar radiculopathy, infection suspected, positive x-ray/CT. Additional history provided: Severe pain.   EXAM: MRI LUMBAR SPINE WITHOUT AND WITH CONTRAST   TECHNIQUE: Multiplanar and multiecho pulse sequences of the lumbar spine were obtained without and with intravenous contrast.   CONTRAST:  8.7mL GADAVIST  GADOBUTROL  1 MMOL/ML IV SOLN   COMPARISON:  CT abdomen/pelvis 11/11/2022.  FINDINGS: Segmentation: There are 6 non-rib-bearing lumbar type vertebral bodies. For the purposes of this dictation, the lowest well-formed intervertebral disc space is designated L5-S1 and ribs are  presumed absent at the T12 level.   Alignment:  No significant spondylolisthesis.   Vertebrae: No lumbar vertebral compression fracture. No significant marrow edema or focal worrisome marrow lesion. Specifically, there are no findings to suggest discitis/osteomyelitis within the lumbar spine.   Conus medullaris and cauda equina: Conus extends to the L1 level. No signal abnormality identified within the visualized distal spinal cord. No pathologic enhancement of the visualized distal spinal cord or cauda equina nerve roots.   Paraspinal and other soft tissues: No acute finding within included portions of the abdomen/retroperitoneum. No paraspinal mass or collection. Nonspecific edema signal within the dorsal subcutaneous fat overlying the lumbar spine.   Disc levels:   Mild disc degeneration at L4-L5 and L5-S1.   T12-L1: This level is imaged in the sagittal plane only. No significant disc herniation or stenosis.   L1-L2: No significant disc herniation or stenosis.   L2-L3: No significant disc herniation or stenosis.   L3-L4: No significant disc herniation or stenosis.   L4-L5: Central posterior annular fissure. Disc bulge. The disc protrusion results in mild-to-moderate bilateral subarticular narrowing (crowding the descending L5 nerve roots) (series 8, image 21). No significant central canal stenosis or neural foraminal narrowing.   L5-S1: Small central disc protrusion (at site of posterior annular fissure). No significant spinal canal or foraminal stenosis.   IMPRESSION: 1. No evidence of lumbar discitis/osteomyelitis. 2. At L4-L5, there is mild disc degeneration. Central posterior annular fissure. A disc bulge results in mild-to-moderate bilateral subarticular narrowing (crowding the descending L5 nerve roots). No significant central canal or neural foraminal stenosis. 3. At L5-S1, there is mild disc degeneration. Small central disc protrusion (at site of posterior  annular fissure). No significant spinal canal or foraminal stenosis.     Electronically Signed   By: Rockey Childs D.O.   On: 02/17/2024 11:58     Objective:  VS:  HT:    WT:   BMI:     BP:(!) 140/84  HR:96bpm  TEMP: ( )  RESP:  Physical Exam Vitals and nursing note reviewed.  Constitutional:      General: She is not in acute distress.    Appearance: Normal appearance. She is not ill-appearing.  HENT:     Head: Normocephalic and atraumatic.     Right Ear: External ear normal.     Left Ear: External ear normal.  Eyes:     Extraocular Movements: Extraocular movements intact.  Cardiovascular:     Rate and Rhythm: Normal rate.     Pulses: Normal pulses.  Pulmonary:     Effort: Pulmonary effort is normal. No respiratory distress.  Abdominal:     General: There is no distension.     Palpations: Abdomen is soft.  Musculoskeletal:        General: Tenderness present.     Cervical back: Neck supple.     Right lower leg: No edema.     Left lower leg: No edema.     Comments: Patient has good distal strength with no pain over the greater trochanters.  No clonus or focal weakness.  Skin:    Findings: No erythema, lesion or rash.  Neurological:     General: No focal deficit present.     Mental Status: She is alert and oriented to person, place, and time.     Sensory:  No sensory deficit.     Motor: No weakness or abnormal muscle tone.     Coordination: Coordination normal.  Psychiatric:        Mood and Affect: Mood normal.        Behavior: Behavior normal.      Imaging: XR C-ARM NO REPORT Result Date: 04/13/2024 Please see Notes tab for imaging impression.

## 2024-04-18 ENCOUNTER — Encounter: Admitting: Physical Medicine and Rehabilitation

## 2024-04-19 ENCOUNTER — Other Ambulatory Visit: Payer: Self-pay | Admitting: Physical Medicine and Rehabilitation

## 2024-04-19 ENCOUNTER — Encounter: Payer: Self-pay | Admitting: Physical Medicine and Rehabilitation

## 2024-04-19 DIAGNOSIS — G8929 Other chronic pain: Secondary | ICD-10-CM

## 2024-04-19 DIAGNOSIS — M5416 Radiculopathy, lumbar region: Secondary | ICD-10-CM

## 2024-04-19 DIAGNOSIS — M5441 Lumbago with sciatica, right side: Secondary | ICD-10-CM

## 2024-04-25 ENCOUNTER — Encounter (INDEPENDENT_AMBULATORY_CARE_PROVIDER_SITE_OTHER): Payer: Self-pay | Admitting: Internal Medicine

## 2024-04-25 ENCOUNTER — Ambulatory Visit (INDEPENDENT_AMBULATORY_CARE_PROVIDER_SITE_OTHER): Admitting: Internal Medicine

## 2024-04-25 VITALS — BP 156/99 | HR 76 | Temp 99.2°F | Ht 61.0 in | Wt 194.0 lb

## 2024-04-25 DIAGNOSIS — Z6837 Body mass index (BMI) 37.0-37.9, adult: Secondary | ICD-10-CM

## 2024-04-25 DIAGNOSIS — I10 Essential (primary) hypertension: Secondary | ICD-10-CM

## 2024-04-25 DIAGNOSIS — E66812 Obesity, class 2: Secondary | ICD-10-CM | POA: Diagnosis not present

## 2024-04-25 DIAGNOSIS — E88819 Insulin resistance, unspecified: Secondary | ICD-10-CM | POA: Diagnosis not present

## 2024-04-25 DIAGNOSIS — E6609 Other obesity due to excess calories: Secondary | ICD-10-CM

## 2024-04-25 MED ORDER — AMLODIPINE BESYLATE 2.5 MG PO TABS: 2.5000 mg | ORAL_TABLET | Freq: Every day | ORAL | 0 refills | Status: DC

## 2024-04-25 MED ORDER — METFORMIN HCL ER 500 MG PO TB24: 500.0000 mg | ORAL_TABLET | Freq: Every day | ORAL | 0 refills | Status: DC

## 2024-04-25 MED ORDER — SEMAGLUTIDE-WEIGHT MANAGEMENT 0.25 MG/0.5ML ~~LOC~~ SOAJ: 0.2500 mg | SUBCUTANEOUS | 0 refills | Status: DC

## 2024-04-25 NOTE — Assessment & Plan Note (Signed)
 Blood pressure is uncontrolled.  She has not been taking amlodipine .  Has been counseled on the risks associated with uncontrolled blood pressure.  She will resume treatment.  Treatment with semaglutide will also help with blood pressure control

## 2024-04-25 NOTE — Progress Notes (Signed)
 Office: [phone]  /  Fax: [phone]  Weight Summary and Body Composition Analysis (BIA)  Vitals Temp: 99.2 F (37.3 C) BP: (!) 156/99 Pulse Rate: 76 SpO2: 100 %   Anthropometric Measurements Height: 5' 1 (1.549 m) Weight: 194 lb (88 kg) BMI (Calculated): 36.67 Weight at Last Visit: 192 lb Weight Lost Since Last Visit: 0 lb Weight Gained Since Last Visit: 2 lb Starting Weight: 203 lb Total Weight Loss (lbs): 9 lb (4.082 kg) Peak Weight: 208 lb   Body Composition  Body Fat %: 44.9 % Fat Mass (lbs): 87.4 lbs Muscle Mass (lbs): 102 lbs Total Body Water (lbs): 78 lbs Visceral Fat Rating : 11    RMR: 1728  Today's Visit #: 11/16/2023  No data recorded  Subjective   Chief Complaint: Obesity  Interval History Discussed the use of AI scribe software for clinical note transcription with the patient, who gave verbal consent to proceed.  History of Present Illness Laura Mejia is a 38 year old female with obesity and hypertension who presents for medical weight management.  She has been experiencing significant back pain since July, attributed to degenerative disc disease in two places and a bulging disc. The pain was severe enough to impair her mobility, rendering her unable to walk and necessitating the use of accessories in the shower. She received a cortisone shot, which did not alleviate the pain, and was unable to work for six weeks, only returning to work the Monday before last.  She has a history of hypertension and was prescribed a blood pressure medication, which she stopped taking due to the inconvenience of managing it during her recent health issues. She is seeking a refill for this medication. No side effects from the blood pressure medication were reported.  She has a history of insulin  resistance and was prescribed metformin  for diabetes prevention, which she continues to take. She stopped taking some medications prescribed for pain or muscle  spasms, including Valium , which was administered during a hospital stay. She also received a one-time dose of triamcinolone for a procedure.  Her sleep has been inadequate, primarily due to pain, and she has been taking Aleve or ibuprofen  for pain relief. She has not been taking certain medications listed in her records, such as trazodone, gabapentin, or calcium supplements.  She has a history of obesity, which she attributes to weight gain since having her children. She initially sought medical attention for weight management due to concerns about her health as she approaches 39. She had previously lost nine pounds through dietary changes and meal replacements but had to pause her routine due to her back pain. She has been trying to maintain a healthy diet, including eating Lean Cuisine meals and [place] yogurt, and was mindful of her calorie intake, aiming to keep it around 1200 calories per day.     Challenges affecting patient progress: having difficulty with meal prep and planning, having difficulty focusing on healthy eating, difficulty implementing reduced calorie nutrition plan, low volume of physical activity at present , orthopedic problems, medical conditions or chronic pain affecting mobility, and all-or- none mindset.    Pharmacotherapy for weight management: She is currently taking Metformin  (off label use for weight management and / or insulin  resistance and / or diabetes prevention) has held medication temporarily, states interest in starting a medication to aid with weight loss citing difficulty with maintaining a reduced calorie state and weight loss, and  .   Assessment and Plan   Treatment Plan  For Obesity:  Recommended Dietary Goals  Laura Mejia is currently in the action stage of change. As such, her goal is to continue weight management plan. She has agreed to: follow the Category 2 plan - 1200 kcal per day, incorporate prepackaged healthy meals for convenience, incorporate 1-2  meal replacements a day for convenience , and continue current plan  Behavioral Health and Counseling  We discussed the following behavioral modification strategies today: work on meal planning and preparation, work on tracking and journaling calories using tracking application, planning for success, continue to work on maintaining a reduced calorie state, getting the recommended amount of protein, incorporating whole foods, making healthy choices, staying well hydrated and practicing mindfulness when eating., increase protein intake, fibrous foods (25 grams per day for women, 30 grams for men) and water to improve satiety and decrease hunger signals. , getting back on track after recent relapse, avoid all or none thinking, display self-compassion, and rethink your why .  Additional education and resources provided today: Handout guide to GLP-1 therapy and how to manage and avoid side effects  Recommended Physical Activity Goals  Laura Mejia has been advised to work up to 150 minutes of moderate intensity aerobic activity a week and strengthening exercises 2-3 times per week for cardiovascular health, weight loss maintenance and preservation of muscle mass.  She has agreed to :  Think about enjoyable ways to increase daily physical activity and overcoming barriers to exercise, Increase physical activity in their day and reduce sedentary time (increase NEAT)., and work on increasing physical activity as tolerated  Medical Interventions and Pharmacotherapy  We discussed various medication options to help Laura Mejia with her weight loss efforts and we both agreed to : Start anti-obesity medication.  In addition to reduced calorie nutrition plan (RCNP), behavioral strategies and physical activity, Laura Mejia would benefit from pharmacotherapy to assist with hunger signals, satiety and cravings. This will reduce obesity-related health risks by inducing weight loss, and help reduce food consumption and adherence  to Laura Mejia) . It may also improve QOL by improving self-confidence and reduce the  setbacks associated with metabolic adaptations.  After discussion of treatment options, mechanisms of action, benefits, side effects, contraindications and shared decision making she is agreeable to starting Wegovy 0.25 mg once a week. Patient also made aware that medication is indicated for long-term management of obesity and the risk of weight regain following discontinuation of treatment and hence the importance of adhering to medical weight loss plan.  We demonstrated use of device and patient using teach back method was able to demonstrate proper technique.  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 Optimal level < 1.9.   This is complex condition associated with genetics, ectopic fat and lifestyle factors. Insulin  resistance may result in increased fat storage, inhibition of the breakdown of fat, cause fluctuations in blood sugar leading to energy crashes and increased cravings for sugary or high carb foods and cause metabolic slowdown making it difficult to lose weight.  This may result in additional weight gain and lead to pre-diabetes and diabetes if untreated. In addition, hyperinsulinemia increases cardiovascular risk, chronic inflammatory response and may increase the risk of obesity related malignancies.  Lab Results  Component Value Date   HGBA1C 5.5 11/16/2023   Lab Results  Component Value Date   INSULIN  21.4 11/16/2023   Lab Results  Component Value Date   GLUCOSE 90 03/11/2024   GLUCOSE 87 03/03/2007  We reviewed treatment options which include losing 7 to 10% of body weight, increasing volume of physical activity and maintaining a diet low in saturated fats and with a low glycemic load.  Patient has also been educated on the carb insulin  model of obesity.  She is currently on metformin  XR 500 mg once a  day.  She will be started on Wegovy 0.25 mg once a week for pharmacoprophylaxis and weight management.  Essential (primary) hypertension Blood pressure is uncontrolled.  She has not been taking amlodipine .  Has been counseled on the risks associated with uncontrolled blood pressure.  She will resume treatment.  Treatment with semaglutide will also help with blood pressure control Class 2 obesity due to excess calories without serious comorbidity with body mass index (BMI) of 37.0 to 37.9 in adult Obesity management is challenging due to degenerative disc disease and a bulging disc, limiting mobility and exercise capacity. Despite setbacks, there is a strong desire to lose weight, especially with the upcoming milestone of turning 39. Previous weight loss efforts were successful, but recent health issues caused regression. The focus is on sustainable weight loss through lifestyle changes, including diet and exercise, rather than relying solely on medications. Discussed the potential use of Wegovy, a GLP-1 receptor agonist, which is covered by insurance, but emphasized the importance of concurrent lifestyle modifications for optimal results. Risks of Wegovy include gastrointestinal side effects, and it requires long-term use for sustained benefits. - Initiate Wegovy at 0.25 mg weekly, pending insurance authorization. - Provide handout on [phone] side effects and management. - Encourage a balanced diet with high protein intake. - Incorporate meal replacements as needed for convenience. - Discuss strategies for meal planning and preparation, including the use of prepackaged meals and meal replacements. - Encourage tracking and journaling of food intake or following a structured meal plan.         Objective   Physical Exam:  Blood pressure (!) 156/99, pulse 76, temperature 99.2 F (37.3 C), height 5' 1 (1.549 m), weight 194 lb (88 kg), SpO2 100%. Body mass index is 36.66 kg/m.  General: She is  overweight, cooperative, alert, well developed, and in no acute distress. PSYCH: Has normal mood, affect and thought process.   HEENT: EOMI, sclerae are anicteric. Lungs: Normal breathing effort, no conversational dyspnea. Extremities: No edema.  Neurologic: No gross sensory or motor deficits. No tremors or fasciculations noted.    Diagnostic Data Reviewed:  BMET    Component Value Date/Time   NA 139 03/11/2024 2016   NA 139 11/16/2023 0950   K 4.4 03/11/2024 2016   CL 111 03/11/2024 2016   CO2 20 (L) 03/11/2024 2016   GLUCOSE 90 03/11/2024 2016   BUN 16 03/11/2024 2016   BUN 10 11/16/2023 0950   CREATININE 0.75 03/11/2024 2016   CALCIUM 8.3 (L) 03/11/2024 2016   GFRNONAA >60 03/11/2024 2016   GFRAA >90 09/09/2011 1545   Lab Results  Component Value Date   HGBA1C 5.5 11/16/2023   Lab Results  Component Value Date   INSULIN  21.4 11/16/2023   Lab Results  Component Value Date   TSH 4.330 11/16/2023   CBC    Component Value Date/Time   WBC 12.5 (H) 03/11/2024 2016   RBC 4.78 03/11/2024 2016   HGB 9.7 (L) 03/11/2024 2016   HGB 10.0 (L) 11/16/2023 0950   HCT 33.7 (L) 03/11/2024 2016   HCT 34.7 11/16/2023 0950   PLT 509 (H) 03/11/2024 2016   PLT 494 (H) 11/16/2023  0950   MCV 70.5 (L) 03/11/2024 2016   MCV 72 (L) 11/16/2023 0950   MCH 20.3 (L) 03/11/2024 2016   MCHC 28.8 (L) 03/11/2024 2016   RDW 19.5 (H) 03/11/2024 2016   RDW 17.5 (H) 11/16/2023 0950   Iron Studies    Component Value Date/Time   IRON 24 (L) 11/19/2022 1222   TIBC 368.2 11/19/2022 1222   FERRITIN 23.3 11/19/2022 1222   IRONPCTSAT 6.5 (L) 11/19/2022 1222   Lipid Panel     Component Value Date/Time   CHOL 178 11/16/2023 0950   TRIG 118 11/16/2023 0950   HDL 59 11/16/2023 0950   LDLCALC 98 11/16/2023 0950   Hepatic Function Panel     Component Value Date/Time   PROT 6.9 03/11/2024 2016   PROT 6.7 11/16/2023 0950   ALBUMIN 3.3 (L) 03/11/2024 2016   ALBUMIN 4.3 11/16/2023 0950   AST  22 03/11/2024 2016   ALT 18 03/11/2024 2016   ALKPHOS 49 03/11/2024 2016   BILITOT 0.9 03/11/2024 2016   BILITOT <0.2 11/16/2023 0950      Component Value Date/Time   TSH 4.330 11/16/2023 0950   Nutritional Lab Results  Component Value Date   VD25OH 6.1 (L) 11/16/2023    Medications: Outpatient Encounter Medications as of 04/25/2024  Medication Sig   meloxicam  (MOBIC ) 15 MG tablet Take 1 tablet (15 mg total) by mouth daily.   semaglutide-weight management (WEGOVY) 0.25 MG/0.5ML SOAJ SQ injection Inject 0.25 mg into the skin once a week for 28 days.   amLODipine  (NORVASC ) 2.5 MG tablet Take 1 tablet (2.5 mg total) by mouth daily.   metFORMIN  (GLUCOPHAGE -XR) 500 MG 24 hr tablet Take 1 tablet (500 mg total) by mouth daily with breakfast.   Vitamin D , Ergocalciferol , (DRISDOL ) 1.25 MG (50000 UNIT) CAPS capsule Take 1 capsule (50,000 Units total) by mouth every 7 (seven) days. (Patient not taking: Reported on 04/25/2024)   [DISCONTINUED] amLODipine  (NORVASC ) 2.5 MG tablet Take 1 tablet (2.5 mg total) by mouth daily. (Patient not taking: Reported on 03/11/2024)   [DISCONTINUED] diazepam  (VALIUM ) 5 MG tablet Take one tablet by mouth with light food one hour prior to procedure.   [DISCONTINUED] dicyclomine  (BENTYL ) 20 MG tablet TAKE 1 TABLET (20 MG TOTAL) BY MOUTH 3 (THREE) TIMES DAILY AS NEEDED FOR SPASMS. (Patient not taking: Reported on 03/11/2024)   [DISCONTINUED] metFORMIN  (GLUCOPHAGE -XR) 500 MG 24 hr tablet Take 1 tablet (500 mg total) by mouth daily with breakfast. (Patient not taking: Reported on 04/25/2024)   [DISCONTINUED] triazolam  (HALCION ) 0.25 MG tablet Please take 1 tablet by mouth approximately 30 minutes prior to procedure with light food only, please do not drive motor vehicle while taking medication.   No facility-administered encounter medications on file as of 04/25/2024.     Follow-Up   Return in about 3 weeks (around 05/16/2024) for For Weight Mangement with Dr.  [email or web address] She was informed of the importance of frequent follow up visits to maximize her success with intensive lifestyle modifications for her multiple health conditions.  Attestation Statement   Reviewed by clinician on day of visit: allergies, medications, problem list, medical history, surgical history, family history, social history, and previous encounter notes.     Lucas Francyne, MD

## 2024-04-25 NOTE — Assessment & Plan Note (Signed)
 Obesity management is challenging due to degenerative disc disease and a bulging disc, limiting mobility and exercise capacity. Despite setbacks, there is a strong desire to lose weight, especially with the upcoming milestone of turning 39. Previous weight loss efforts were successful, but recent health issues caused regression. The focus is on sustainable weight loss through lifestyle changes, including diet and exercise, rather than relying solely on medications. Discussed the potential use of Wegovy, a GLP-1 receptor agonist, which is covered by insurance, but emphasized the importance of concurrent lifestyle modifications for optimal results. Risks of Wegovy include gastrointestinal side effects, and it requires long-term use for sustained benefits. - Initiate Wegovy at 0.25 mg weekly, pending insurance authorization. - Provide handout on [phone] side effects and management. - Encourage a balanced diet with high protein intake. - Incorporate meal replacements as needed for convenience. - Discuss strategies for meal planning and preparation, including the use of prepackaged meals and meal replacements. - Encourage tracking and journaling of food intake or following a structured meal plan.

## 2024-04-25 NOTE — Assessment & Plan Note (Signed)
 Her HOMA-IR is 4.6 which is elevated. Optimal level < 1.9.   This is complex condition associated with genetics, ectopic fat and lifestyle factors. Insulin  resistance may result in increased fat storage, inhibition of the breakdown of fat, cause fluctuations in blood sugar leading to energy crashes and increased cravings for sugary or high carb foods and cause metabolic slowdown making it difficult to lose weight.  This may result in additional weight gain and lead to pre-diabetes and diabetes if untreated. In addition, hyperinsulinemia increases cardiovascular risk, chronic inflammatory response and may increase the risk of obesity related malignancies.  Lab Results  Component Value Date   HGBA1C 5.5 11/16/2023   Lab Results  Component Value Date   INSULIN  21.4 11/16/2023   Lab Results  Component Value Date   GLUCOSE 90 03/11/2024   GLUCOSE 87 03/03/2007    We reviewed treatment options which include losing 7 to 10% of body weight, increasing volume of physical activity and maintaining a diet low in saturated fats and with a low glycemic load.  Patient has also been educated on the carb insulin  model of obesity.  She is currently on metformin  XR 500 mg once a day.  She will be started on Wegovy 0.25 mg once a week for pharmacoprophylaxis and weight management.

## 2024-04-26 ENCOUNTER — Encounter: Payer: Self-pay | Admitting: Physical Medicine and Rehabilitation

## 2024-04-26 ENCOUNTER — Telehealth (INDEPENDENT_AMBULATORY_CARE_PROVIDER_SITE_OTHER): Payer: Self-pay

## 2024-04-26 NOTE — Telephone Encounter (Signed)
 PA started

## 2024-04-27 ENCOUNTER — Encounter (INDEPENDENT_AMBULATORY_CARE_PROVIDER_SITE_OTHER): Payer: Self-pay

## 2024-04-27 NOTE — Telephone Encounter (Signed)
 PA- Wegovy  approved, pt notified

## 2024-04-27 NOTE — Telephone Encounter (Signed)
 Error

## 2024-05-06 ENCOUNTER — Ambulatory Visit (INDEPENDENT_AMBULATORY_CARE_PROVIDER_SITE_OTHER): Admitting: Rehabilitative and Restorative Service Providers"

## 2024-05-06 ENCOUNTER — Encounter: Payer: Self-pay | Admitting: Rehabilitative and Restorative Service Providers"

## 2024-05-06 DIAGNOSIS — M5416 Radiculopathy, lumbar region: Secondary | ICD-10-CM | POA: Diagnosis not present

## 2024-05-06 DIAGNOSIS — M5459 Other low back pain: Secondary | ICD-10-CM

## 2024-05-06 DIAGNOSIS — R262 Difficulty in walking, not elsewhere classified: Secondary | ICD-10-CM | POA: Diagnosis not present

## 2024-05-06 DIAGNOSIS — M6281 Muscle weakness (generalized): Secondary | ICD-10-CM | POA: Diagnosis not present

## 2024-05-06 DIAGNOSIS — R293 Abnormal posture: Secondary | ICD-10-CM

## 2024-05-06 NOTE — Therapy (Signed)
 OUTPATIENT PHYSICAL THERAPY THORACOLUMBAR EVALUATION   Patient Name: Laura Mejia MRN: [id number] DOB:1985/08/21, 38 y.o., female Today's Date: 05/06/2024  END OF SESSION:  PT End of Session - 05/06/24 1617     Visit Number 1    Number of Visits 16    Date for Recertification  07/01/24    Progress Note Due on Visit 16    PT Start Time 1430    PT Stop Time 1516    PT Time Calculation (min) 46 min    Activity Tolerance Patient tolerated treatment well;No increased pain;Patient limited by pain    Behavior During Therapy [facility] for tasks assessed/performed          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)    Depression    Diverticulitis    Gallbladder problem    GERD (gastroesophageal reflux disease)    Heart murmur    Palpitations    Vitamin D  deficiency    Past Surgical History:  Procedure Laterality Date   CESAREAN SECTION     CHOLECYSTECTOMY     Patient Active Problem List   Diagnosis Date Noted   Iron deficiency anemia 11/30/2023   Vitamin D  deficiency 11/30/2023   Insulin  resistance 11/30/2023   Essential (primary) hypertension 11/16/2023   Class 2 obesity due to excess calories without serious comorbidity with body mass index (BMI) of 37.0 to 37.9 in adult 11/16/2023   Suspected sleep apnea 11/16/2023   Chest pain of uncertain etiology 04/10/2021   PAC (premature atrial contraction) 10/20/2011   Palpitations 09/22/2011   Murmur, cardiac 09/22/2011    PCP: NA  REFERRING PROVIDER: Megan E. Trudy, NP  REFERRING DIAG: [phone] (ICD-10-CM) - Acute bilateral low back pain with bilateral sciatica M54.16 (ICD-10-CM) - Lumbar radiculopathy  Rationale for Evaluation and Treatment: Rehabilitation  THERAPY DIAG:  Difficulty in walking, not elsewhere classified  Abnormal posture  Muscle weakness  (generalized)  Radiculopathy, lumbar region  Other low back pain  ONSET DATE: July 19 onset after a walk  SUBJECTIVE:                                                                                                                                                                                           SUBJECTIVE STATEMENT: Laura Mejia has had sciatica previously.  This episode was after a walk on February 13, 2024.  Current symptoms include left sciatica to the foot and bilateral hip pain to the groin and gluteals; Right  side sciatica is no more distal than the knee.  She has a hard time finding a comfortable position and is not sleeping well.  She has been out of work due to her severe muscle spasm and inability to function secondary to this condition.  PERTINENT HISTORY:  Anxiety, previous C-section  PAIN:  Are you having pain? Yes: NPRS scale: Low back 4-8/10 this week; left lower extremity 5-9/10 this week; right lower extremity 5-9/10 this week Pain location: See above Pain description: Sharp, ache, pressure Aggravating factors: Lying down and walking Relieving factors: NA  PRECAUTIONS: Back  RED FLAGS: Increased frequency   WEIGHT BEARING RESTRICTIONS: No  FALLS:  Has patient fallen in last 6 months? Yes. Number of falls 2, due to spasm  LIVING ENVIRONMENT: Lives with: lives with their family and lives with their son Lives in: House/apartment Stairs: Tries to avoid stairs Has following equipment at home: Single point cane, Environmental consultant - 2 wheeled, and Grab bars  OCCUPATION: [employer]  PLOF: Independent  PATIENT GOALS: Get back to normal, this has been going on since July  NEXT MD VISIT: Laura Mejia Monday 05/09/2024  OBJECTIVE:  Note: Objective measures were completed at Evaluation unless otherwise noted.  DIAGNOSTIC FINDINGS:  1. No evidence of lumbar discitis/osteomyelitis. 2. At L4-L5, there is mild disc degeneration. Central posterior annular  fissure. A disc bulge results in mild-to-moderate bilateral subarticular narrowing (crowding the descending L5 nerve roots). No significant central canal or neural foraminal stenosis. 3. At L5-S1, there is mild disc degeneration. Small central disc protrusion (at site of posterior annular fissure). No significant spinal canal or foraminal stenosis.  PATIENT SURVEYS:  PSFS: THE PATIENT SPECIFIC FUNCTIONAL SCALE  Place score of 0-10 (0 = unable to perform activity and 10 = able to perform activity at the same level as before injury or problem)  Activity Date: 05/06/2024    Bending 0    2.  Standing 4    3.  Sitting 2    4.  Walking 4    5.  Sleeping 4    Total Score 2.8      Total Score = Sum of activity scores/number of activities  Minimally Detectable Change: 3 points (for single activity); 2 points (for average score)  [facility] (nd). The Patient Specific Functional Scale . Retrieved from [email or web address]   COGNITION: Overall cognitive status: Within functional limits for tasks assessed     SENSATION: [name] has left sided radicular symptoms as distal as the foot and right sided peripheral symptoms as distal as the knee.  She also notes bilateral groin, hip and gluteal pain  MUSCLE LENGTH: Hamstrings: Right 45 deg; Left 25 deg  POSTURE: rounded shoulders, forward head, decreased lumbar lordosis, and weight shift right  LUMBAR ROM:   AROM 05/06/2024  Flexion   Extension <5  Right lateral flexion 20  Left lateral flexion 20  Right rotation   Left rotation    (Blank rows = not tested)  LOWER EXTREMITY ROM:     Passive  Left/Right 05/06/2024   Hip flexion 70/75   Hip extension    Hip abduction    Hip adduction    Hip internal rotation    Hip external rotation    Knee flexion    Knee extension    Ankle dorsiflexion    Ankle plantarflexion    Ankle inversion    Ankle eversion     (Blank  rows = not tested)  LOWER EXTREMITY  STRENGTH: Objective measures deferred secondary to discomfort  MMT    Hip flexion    Hip extension    Hip abduction    Hip adduction    Hip internal rotation    Hip external rotation    Knee flexion    Knee extension    Ankle dorsiflexion    Ankle plantarflexion    Ankle inversion    Ankle eversion     (Blank rows = not tested)  GAIT: Distance walked: 50 feet Assistive device utilized: None Level of assistance: Complete Independence Comments: Ruberta has a very stiff gait appearance and she notes she gets significant increases in pain after only 2 to 3 minutes of standing or walking  TREATMENT DATE:  05/06/2024 Single knee-to-chest stretch with other leg straight 4 x 20 seconds Lumbar extension AROM with hands on gluteals and hips forward 10 x 3 seconds  02464: Reviewed imaging and radiology reports; discussed spine posture in various positions; discussed spine anatomy basics; cover basic posture and body mechanics including the importance of changing position frequently and avoiding slouched, flexed postures                                                                                                                               PATIENT EDUCATION:  Education details: See above Person educated: Patient Education method: Explanation, Demonstration, Tactile cues, Verbal cues, and Handouts Education comprehension: verbalized understanding, returned demonstration, verbal cues required, tactile cues required, and needs further education  HOME EXERCISE PROGRAM: Access Code: [email or web address] URL: [email or web address] Date: 05/06/2024 Prepared by: Lamar Ivory  Exercises - Standing Lumbar Extension at Wall - Forearms  - 5-15 x daily - 7 x weekly - 1 sets - 5 reps - 3 seconds hold - Single Knee to Chest Stretch  - 2-3 x daily - 7 x weekly - 1 sets - 5 reps - 20 seconds hold  ASSESSMENT:  CLINICAL IMPRESSION: Patient is a 38  y.o. female who was seen today for physical therapy evaluation and treatment for M54.42,M54.41 (ICD-10-CM) - Acute bilateral low back pain with bilateral sciatica M54.16 (ICD-10-CM) - Lumbar radiculopathy.  Siddhi had a sudden onset of low back spasm and bilateral sciatica after a walk in July of this year.  She has been out of work and has had very little relief with a previous injection.  She has severe lumbar paraspinal muscle spasm, postural abnormalities, flexibility and strength impairments in need of supervised physical therapy.  She is also following up with Tory Gaskins, PA-C on Monday and I encouraged her to ask Tory about her imaging and whether she might be appropriate for referral to a neurosurgeon.  I do not currently believe she needs to see a neurosurgeon, but given the amount of time it would take her to get into see one, she would have likely had enough appropriate physical therapy at that time to be appropriate for neurosurgery referral if progress with physical therapy is not  as anticipated.  OBJECTIVE IMPAIRMENTS: Abnormal gait, decreased activity tolerance, decreased endurance, decreased knowledge of condition, decreased mobility, difficulty walking, decreased ROM, decreased strength, decreased safety awareness, impaired perceived functional ability, increased muscle spasms, impaired flexibility, improper body mechanics, postural dysfunction, obesity, and pain.   ACTIVITY LIMITATIONS: carrying, lifting, bending, sitting, standing, squatting, sleeping, stairs, transfers, bed mobility, and locomotion level  PARTICIPATION LIMITATIONS: shopping, community activity, and occupation  PERSONAL FACTORS: Anxiety, previous C-section are also affecting patient's functional outcome.   REHAB POTENTIAL: Good  CLINICAL DECISION MAKING: Evolving/moderate complexity  EVALUATION COMPLEXITY: Moderate   GOALS: Goals reviewed with patient? Yes  SHORT TERM GOALS: Target date: 06/03/2024  Deairra  will be independent with her day 1 home exercise program Baseline: Started 05/06/2024 Goal status: INITIAL  2.  Improve lumbar extension AROM to at least 10 degrees Baseline: Less than 5 degrees Goal status: INITIAL  3.  Improve flexibility for bilateral hip flexion to at least 90 degrees and hamstrings to at least 45 degrees Baseline: 70/75 and 25/45 respectively Goal status: INITIAL   LONG TERM GOALS: Target date: 07/01/2024  Improve patient-specific functional score to 5 Baseline: 2.8 Goal status: INITIAL  2.  Felicia will report low back pain consistently 0-3/10 on the numeric pain rating scale with no complaints of hip or sciatic pain. Baseline: Can be as high as 9/10 with bilateral hip and sciatic pain Goal status: INITIAL  3.  Improve lumbar extension AROM to 20 degrees Baseline: Less than 5 degrees Goal status: INITIAL  4.  Improve bilateral hip flexion to at least 95 degrees; hamstrings to 45 degrees and hip external rotation to 40 degrees Baseline: See short-term goal Goal status: INITIAL  5.  December will be able to return to work without restrictions Baseline: Unable to work Goal status: INITIAL  6.  Olita will be independent with her long-term maintenance home exercise program at discharge Baseline: Started 05/06/2024 Goal status: INITIAL  PLAN:  PT FREQUENCY: 1-2x/week  PT DURATION: 8 weeks  PLANNED INTERVENTIONS: 97750- Physical Performance Testing, 97110-Therapeutic exercises, 97530- Therapeutic activity, 97112- Neuromuscular re-education, 97535- Self Care, 02859- Manual therapy, G0283- Electrical stimulation (unattended), 97012- Traction (mechanical), 20560 (1-2 muscles), 20561 (3+ muscles)- Dry Needling, Patient/Family education, Spinal mobilization, Cryotherapy, and Moist heat.  PLAN FOR NEXT SESSION: Review day 1 home exercises.  Early emphasis on quieting spasm, improving posture and body mechanics awareness.  Appropriate supine stretches and  strengthening activity to progress to standing and prone as appropriate.   Myer LELON Ivory, PT, MPT 05/06/2024, 4:45 PM

## 2024-05-09 ENCOUNTER — Ambulatory Visit (INDEPENDENT_AMBULATORY_CARE_PROVIDER_SITE_OTHER): Admitting: Physician Assistant

## 2024-05-09 ENCOUNTER — Other Ambulatory Visit

## 2024-05-09 ENCOUNTER — Encounter: Payer: Self-pay | Admitting: Radiology

## 2024-05-09 DIAGNOSIS — M25551 Pain in right hip: Secondary | ICD-10-CM | POA: Diagnosis not present

## 2024-05-09 DIAGNOSIS — M25552 Pain in left hip: Secondary | ICD-10-CM

## 2024-05-09 NOTE — Progress Notes (Signed)
   HPI: [name] 38 year old female were seen today for bilateral hip pain and low back pain.  She is a patient of Dr. Eldonna and Duwaine Pouch, FNP.  She has had radicular symptoms and low back pain which she has undergone left L5-S1 lumbar interlaminar epidural steroid injections without any relief.  She states the pain began July 19 and was first nervelike which she is describes as sciatica.  MRI lumbar spine did show L4-5 mild disc degeneration and a central posterior annular fissure at L4-5.  Disc bulge at the same level resulting in mild to moderate bilateral narrowing crowding the descending L5 nerve roots.  No central canal stenosis.  L4-5 S1 mild disc degeneration and small central disc protrusion with posterior annular fissure. She notes that her left hip pain is 10 out of 10 pain at times.  Right hip pain is 7-8 out of 10 pain.  She has problems donning shoes or socks.  Most of the left hip pain she describes as internal.  She points to the groin area on the left.  She has pain in both hips with with walking.  She has also tried ibuprofen : Prednisone : Gabapentin Celebrex  and Zanaflex  for the pain without relief.  Notes that the pulling pain is worse in the morning.  Review of systems: See HPI otherwise negative  Physical exam: General Well-developed well-nourished female in no acute distress.  Ambulates without assistive device.  Psych: Alert and oriented x 3 Bilateral hips: Nontender at trochanteric region bilaterally.  Good range of motion of both hips.  Discomfort with the left hip with internal rotation.  Flexion bilateral hips causes no pain.  Straight leg raise negative on the left positive on the right.  Ambulates with a nonantalgic gait no assistive device.  Radiographs:  AP pelvis lateral view of the right hip: Bilateral hips well located.  Hip joints overall well-maintained.  No acute fractures acute findings.  No bony abnormalities or signs of AVN.  Impression: Left greater  than right hip pain  Plan: Given the fact that she has gotten no relief with epidural steroid injections, medications, stretching exercises at home and continues to have left hip pain in the groin region since July this year.  Recommend MRI of the left hip to evaluate the cartilage given the normal-appearing radiographs.  Have her follow-up after the MRI to go over results discuss further treatment.  Questions were encouraged and answered.  She is given a note to keep her out of work until follow-up after the MRI.  She works at [employer] [place] and is unable to perform her duties at this point in time given the back pain and bilateral hip pain.

## 2024-05-10 ENCOUNTER — Encounter: Payer: Self-pay | Admitting: Physician Assistant

## 2024-05-10 ENCOUNTER — Other Ambulatory Visit: Payer: Self-pay | Admitting: Physician Assistant

## 2024-05-10 MED ORDER — GABAPENTIN 300 MG PO CAPS: 300.0000 mg | ORAL_CAPSULE | Freq: Every day | ORAL | 1 refills | Status: DC

## 2024-05-10 MED ORDER — CYCLOBENZAPRINE HCL 10 MG PO TABS: 10.0000 mg | ORAL_TABLET | Freq: Three times a day (TID) | ORAL | 1 refills | Status: DC | PRN

## 2024-05-10 NOTE — Telephone Encounter (Signed)
 Duplicate

## 2024-05-16 ENCOUNTER — Encounter: Payer: Self-pay | Admitting: Physical Medicine and Rehabilitation

## 2024-05-17 ENCOUNTER — Ambulatory Visit (INDEPENDENT_AMBULATORY_CARE_PROVIDER_SITE_OTHER): Admitting: Internal Medicine

## 2024-05-17 NOTE — Therapy (Addendum)
 " OUTPATIENT PHYSICAL THERAPY THORACOLUMBAR TREATMENT / DISCHARGE   Patient Name: Laura Mejia MRN: [id number] DOB:[birth year], 38 y.o., female Today's Date: 05/18/2024  END OF SESSION:  PT End of Session - 05/18/24 1430     Visit Number 2    Number of Visits 16    Date for Recertification  07/01/24    Progress Note Due on Visit 16    PT Start Time 1431    PT Stop Time 1516    PT Time Calculation (min) 45 min    Activity Tolerance Patient tolerated treatment well;No increased pain;Patient limited by pain    Behavior During Therapy [facility] for tasks assessed/performed           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)    Depression    Diverticulitis    Gallbladder problem    GERD (gastroesophageal reflux disease)    Heart murmur    Palpitations    Vitamin D  deficiency    Past Surgical History:  Procedure Laterality Date   CESAREAN SECTION     CHOLECYSTECTOMY     Patient Active Problem List   Diagnosis Date Noted   Iron deficiency anemia 11/30/2023   Vitamin D  deficiency 11/30/2023   Insulin  resistance 11/30/2023   Essential (primary) hypertension 11/16/2023   Class 2 obesity due to excess calories without serious comorbidity with body mass index (BMI) of 37.0 to 37.9 in adult 11/16/2023   Suspected sleep apnea 11/16/2023   Chest pain of uncertain etiology 04/10/2021   PAC (premature atrial contraction) 10/20/2011   Palpitations 09/22/2011   Murmur, cardiac 09/22/2011    PCP: NA  REFERRING PROVIDER: Megan E. Trudy, NP  REFERRING DIAG: [phone] (ICD-10-CM) - Acute bilateral low back pain with bilateral sciatica M54.16 (ICD-10-CM) - Lumbar radiculopathy  Rationale for Evaluation and Treatment: Rehabilitation  THERAPY DIAG:  Other low back pain  Difficulty in walking, not elsewhere classified  Abnormal  posture  Muscle weakness (generalized)  Radiculopathy, lumbar region  ONSET DATE: July 19 onset after a walk  SUBJECTIVE:                                                                                                                                                                                           SUBJECTIVE STATEMENT: I've been miserable since July.   PERTINENT HISTORY:  Anxiety, previous C-section  Laura Mejia has had sciatica previously.  This episode was after a walk on February 13, 2024.  Current symptoms include left sciatica to the foot and bilateral hip pain to the groin and gluteals; Right side sciatica is no more distal than the knee.  She has a hard time finding a comfortable position and is not sleeping well.  She has been out of work due to her severe muscle spasm and inability to function secondary to this condition.  PAIN:  Are you having pain? Yes: NPRS scale: 7/10 Rt>Lt  Pain location: See above Pain description: Sharp, ache, pressure Aggravating factors: Lying down and walking Relieving factors: NA  PRECAUTIONS: Back  RED FLAGS: Increased frequency   WEIGHT BEARING RESTRICTIONS: No  FALLS:  Has patient fallen in last 6 months? Yes. Number of falls 2, due to spasm  LIVING ENVIRONMENT: Lives with: lives with their family and lives with their son Lives in: House/apartment Stairs: Tries to avoid stairs Has following equipment at home: Single point cane, Environmental Consultant - 2 wheeled, and Grab bars  OCCUPATION: [employer]  PLOF: Independent  PATIENT GOALS: Get back to normal, this has been going on since July  NEXT MD VISIT: Judyann Makos Monday 05/09/2024  OBJECTIVE:  Note: Objective measures were completed at Evaluation unless otherwise noted.  DIAGNOSTIC FINDINGS:  1. No evidence of lumbar discitis/osteomyelitis. 2. At L4-L5, there is mild disc degeneration. Central posterior annular fissure. A disc bulge results in mild-to-moderate  bilateral subarticular narrowing (crowding the descending L5 nerve roots). No significant central canal or neural foraminal stenosis. 3. At L5-S1, there is mild disc degeneration. Small central disc protrusion (at site of posterior annular fissure). No significant spinal canal or foraminal stenosis.  PATIENT SURVEYS:  PSFS: THE PATIENT SPECIFIC FUNCTIONAL SCALE  Place score of 0-10 (0 = unable to perform activity and 10 = able to perform activity at the same level as before injury or problem)  Activity Date: 05/06/2024    Bending 0    2.  Standing 4    3.  Sitting 2    4.  Walking 4    5.  Sleeping 4    Total Score 2.8      Total Score = Sum of activity scores/number of activities  Minimally Detectable Change: 3 points (for single activity); 2 points (for average score)  [facility] (nd). The Patient Specific Functional Scale . Retrieved from [email or web address]   COGNITION: Overall cognitive status: Within functional limits for tasks assessed     SENSATION: Raveen has left sided radicular symptoms as distal as the foot and right sided peripheral symptoms as distal as the knee.  She also notes bilateral groin, hip and gluteal pain  MUSCLE LENGTH: Hamstrings: Right 45 deg; Left 25 deg  POSTURE: rounded shoulders, forward head, decreased lumbar lordosis, and weight shift right  LUMBAR ROM:   AROM 05/06/2024  Flexion   Extension <5  Right lateral flexion 20  Left lateral flexion 20  Right rotation   Left rotation    (Blank rows = not tested)  LOWER EXTREMITY ROM:     Passive  Left/Right 05/06/2024   Hip flexion 70/75   Hip extension    Hip abduction    Hip adduction    Hip internal rotation    Hip external rotation    Knee flexion    Knee extension    Ankle dorsiflexion    Ankle plantarflexion    Ankle inversion    Ankle eversion     (Blank rows = not tested)  LOWER EXTREMITY STRENGTH:  Objective measures  deferred secondary to discomfort  MMT    Hip flexion    Hip extension    Hip abduction    Hip adduction    Hip internal rotation    Hip external rotation    Knee flexion    Knee extension    Ankle dorsiflexion    Ankle plantarflexion    Ankle inversion    Ankle eversion     (Blank rows = not tested)  GAIT: Distance walked: 50 feet Assistive device utilized: None Level of assistance: Complete Independence Comments: Amare has a very stiff gait appearance and she notes she gets significant increases in pain after only 2 to 3 minutes of standing or walking  TREATMENT DATE:  05/18/2024 Manual:  STM to lumbar musculature and bilat glutes (Rt>Lt); TTP at Rt SIJ and sacrum   TherEx:  Lower trunk rotation, only able to do to the Rt secondary to Rt hip pain with stretch, 1x10 with 5s hold  Attempted to the Rt following hamstring stretch with ability to perform 1x10 with 5s hold  Attempted posterior pelvic tilt, but highly painful  Supine PROM hamstring stretch 2x30s   Self-Care: PT discussed POC and dry needling and provided patient with informational handout    05/06/2024 Single knee-to-chest stretch with other leg straight 4 x 20 seconds Lumbar extension AROM with hands on gluteals and hips forward 10 x 3 seconds  02464: Reviewed imaging and radiology reports; discussed spine posture in various positions; discussed spine anatomy basics; cover basic posture and body mechanics including the importance of changing position frequently and avoiding slouched, flexed postures                                                                                                                               PATIENT EDUCATION:  Education details: See above Person educated: Patient Education method: Explanation, Demonstration, Tactile cues, Verbal cues, and Handouts Education comprehension: verbalized understanding, returned demonstration, verbal cues required, tactile  cues required, and needs further education  HOME EXERCISE PROGRAM: Access Code: [email or web address] URL: [email or web address] Date: 05/06/2024 Prepared by: Lamar Ivory  Exercises - Standing Lumbar Extension at Wall - Forearms  - 5-15 x daily - 7 x weekly - 1 sets - 5 reps - 3 seconds hold - Single Knee to Chest Stretch  - 2-3 x daily - 7 x weekly - 1 sets - 5 reps - 20 seconds hold  ASSESSMENT:  CLINICAL IMPRESSION: Patient arrived to session noting high levels of pain Rt>Lt this date. Patient was highly limited during session today and required increased time to perform exercises and transitions. PT discussed with patient that she may be a good candidate for dry needling and provided her with informational handout. Patient will continue to benefit from skilled PT.   OBJECTIVE IMPAIRMENTS: Abnormal gait, decreased activity tolerance, decreased endurance, decreased knowledge of condition, decreased mobility, difficulty walking, decreased ROM, decreased strength, decreased safety awareness, impaired perceived functional ability, increased muscle spasms,  impaired flexibility, improper body mechanics, postural dysfunction, obesity, and pain.   ACTIVITY LIMITATIONS: carrying, lifting, bending, sitting, standing, squatting, sleeping, stairs, transfers, bed mobility, and locomotion level  PARTICIPATION LIMITATIONS: shopping, community activity, and occupation  PERSONAL FACTORS: Anxiety, previous C-section are also affecting patient's functional outcome.   REHAB POTENTIAL: Good  CLINICAL DECISION MAKING: Evolving/moderate complexity  EVALUATION COMPLEXITY: Moderate   GOALS: Goals reviewed with patient? Yes  SHORT TERM GOALS: Target date: 06/03/2024  Jahniya will be independent with her day 1 home exercise program Baseline: Started 05/06/2024 Goal status: INITIAL  2.  Improve lumbar extension AROM to at least 10 degrees Baseline: Less than 5 degrees Goal status: INITIAL  3.   Improve flexibility for bilateral hip flexion to at least 90 degrees and hamstrings to at least 45 degrees Baseline: 70/75 and 25/45 respectively Goal status: INITIAL   LONG TERM GOALS: Target date: 07/01/2024  Improve patient-specific functional score to 5 Baseline: 2.8 Goal status: INITIAL  2.  Idamay will report low back pain consistently 0-3/10 on the numeric pain rating scale with no complaints of hip or sciatic pain. Baseline: Can be as high as 9/10 with bilateral hip and sciatic pain Goal status: INITIAL  3.  Improve lumbar extension AROM to 20 degrees Baseline: Less than 5 degrees Goal status: INITIAL  4.  Improve bilateral hip flexion to at least 95 degrees; hamstrings to 45 degrees and hip external rotation to 40 degrees Baseline: See short-term goal Goal status: INITIAL  5.  Jelitza will be able to return to work without restrictions Baseline: Unable to work Goal status: INITIAL  6.  Sherel will be independent with her long-term maintenance home exercise program at discharge Baseline: Started 05/06/2024 Goal status: INITIAL  PLAN:  PT FREQUENCY: 1-2x/week  PT DURATION: 8 weeks  PLANNED INTERVENTIONS: 97750- Physical Performance Testing, 97110-Therapeutic exercises, 97530- Therapeutic activity, 97112- Neuromuscular re-education, 97535- Self Care, 02859- Manual therapy, G0283- Electrical stimulation (unattended), 97012- Traction (mechanical), 20560 (1-2 muscles), 20561 (3+ muscles)- Dry Needling, Patient/Family education, Spinal mobilization, Cryotherapy, and Moist heat.  PLAN FOR NEXT SESSION: dry needling (?), posture and body mechanics, general flexibility and strengthening within tolerance, sciatic nerve glide (?)   PHYSICAL THERAPY DISCHARGE SUMMARY  Visits from Start of Care: 2  Current functional level related to goals / functional outcomes: See above    Remaining deficits: See above    Education / Equipment: HEP   Patient agrees to discharge.  Patient goals were not met. Patient is being discharged due to not returning since the last visit.    Susannah Daring, PT, DPT 05/18/24 3:30 PM   "

## 2024-05-18 ENCOUNTER — Ambulatory Visit (INDEPENDENT_AMBULATORY_CARE_PROVIDER_SITE_OTHER)

## 2024-05-18 DIAGNOSIS — M5459 Other low back pain: Secondary | ICD-10-CM

## 2024-05-18 DIAGNOSIS — R262 Difficulty in walking, not elsewhere classified: Secondary | ICD-10-CM | POA: Diagnosis not present

## 2024-05-18 DIAGNOSIS — M6281 Muscle weakness (generalized): Secondary | ICD-10-CM | POA: Diagnosis not present

## 2024-05-18 DIAGNOSIS — R293 Abnormal posture: Secondary | ICD-10-CM | POA: Diagnosis not present

## 2024-05-18 DIAGNOSIS — M5416 Radiculopathy, lumbar region: Secondary | ICD-10-CM

## 2024-05-21 ENCOUNTER — Ambulatory Visit
Admission: RE | Admit: 2024-05-21 | Discharge: 2024-05-21 | Disposition: A | Discharge status: Home / Self Care | Source: Ambulatory Visit | Attending: Physician Assistant | Admitting: Physician Assistant

## 2024-05-21 DIAGNOSIS — M25552 Pain in left hip: Secondary | ICD-10-CM

## 2024-05-22 ENCOUNTER — Other Ambulatory Visit (INDEPENDENT_AMBULATORY_CARE_PROVIDER_SITE_OTHER): Payer: Self-pay | Admitting: Internal Medicine

## 2024-05-22 DIAGNOSIS — E88819 Insulin resistance, unspecified: Secondary | ICD-10-CM

## 2024-05-23 ENCOUNTER — Other Ambulatory Visit: Payer: Self-pay | Admitting: Radiology

## 2024-05-23 ENCOUNTER — Telehealth: Payer: Self-pay | Admitting: Radiology

## 2024-05-23 ENCOUNTER — Encounter: Admitting: Physical Therapy

## 2024-05-23 ENCOUNTER — Encounter: Payer: Self-pay | Admitting: Radiology

## 2024-05-23 DIAGNOSIS — M5441 Lumbago with sciatica, right side: Secondary | ICD-10-CM

## 2024-05-23 NOTE — Telephone Encounter (Signed)
 MRI report available in Epic, patient has appointment scheduled for Wed. 10/29

## 2024-05-23 NOTE — Telephone Encounter (Signed)
 Received MRI results in EPIC. New MRI LUMBAR W. W/O CONTRAST orders along with Infectious Disease referral. MRI REVIEW appt canceled for Wed. 10/29

## 2024-05-25 ENCOUNTER — Ambulatory Visit (INDEPENDENT_AMBULATORY_CARE_PROVIDER_SITE_OTHER): Admitting: Internal Medicine

## 2024-05-25 ENCOUNTER — Ambulatory Visit: Admitting: Physician Assistant

## 2024-05-27 ENCOUNTER — Encounter

## 2024-05-27 ENCOUNTER — Other Ambulatory Visit

## 2024-05-30 ENCOUNTER — Encounter: Payer: Self-pay | Admitting: Radiology

## 2024-05-30 ENCOUNTER — Encounter: Admitting: Physical Therapy

## 2024-05-30 ENCOUNTER — Other Ambulatory Visit: Payer: Self-pay

## 2024-05-30 ENCOUNTER — Encounter (HOSPITAL_COMMUNITY): Payer: Self-pay | Admitting: Family Medicine

## 2024-05-30 ENCOUNTER — Inpatient Hospital Stay (HOSPITAL_COMMUNITY)
Admission: EM | Admit: 2024-05-30 | Discharge: 2024-06-04 | DRG: 867 | Disposition: A | Discharge status: Home Health | Attending: Family Medicine | Admitting: Family Medicine

## 2024-05-30 ENCOUNTER — Ambulatory Visit: Admitting: Internal Medicine

## 2024-05-30 ENCOUNTER — Emergency Department (HOSPITAL_COMMUNITY)

## 2024-05-30 DIAGNOSIS — L03312 Cellulitis of back [any part except buttock]: Secondary | ICD-10-CM | POA: Diagnosis present

## 2024-05-30 DIAGNOSIS — Z7984 Long term (current) use of oral hypoglycemic drugs: Secondary | ICD-10-CM | POA: Diagnosis not present

## 2024-05-30 DIAGNOSIS — Z975 Presence of (intrauterine) contraceptive device: Secondary | ICD-10-CM

## 2024-05-30 DIAGNOSIS — T8029XA Infection following other infusion, transfusion and therapeutic injection, initial encounter: Secondary | ICD-10-CM | POA: Diagnosis present

## 2024-05-30 DIAGNOSIS — M4646 Discitis, unspecified, lumbar region: Secondary | ICD-10-CM | POA: Diagnosis present

## 2024-05-30 DIAGNOSIS — Z833 Family history of diabetes mellitus: Secondary | ICD-10-CM

## 2024-05-30 DIAGNOSIS — D649 Anemia, unspecified: Secondary | ICD-10-CM | POA: Diagnosis not present

## 2024-05-30 DIAGNOSIS — I10 Essential (primary) hypertension: Secondary | ICD-10-CM | POA: Diagnosis present

## 2024-05-30 DIAGNOSIS — R338 Other retention of urine: Secondary | ICD-10-CM | POA: Diagnosis present

## 2024-05-30 DIAGNOSIS — F419 Anxiety disorder, unspecified: Secondary | ICD-10-CM | POA: Diagnosis present

## 2024-05-30 DIAGNOSIS — E66811 Obesity, class 1: Secondary | ICD-10-CM | POA: Diagnosis present

## 2024-05-30 DIAGNOSIS — G061 Intraspinal abscess and granuloma: Secondary | ICD-10-CM | POA: Diagnosis present

## 2024-05-30 DIAGNOSIS — Z6835 Body mass index (BMI) 35.0-35.9, adult: Secondary | ICD-10-CM

## 2024-05-30 DIAGNOSIS — F32A Depression, unspecified: Secondary | ICD-10-CM | POA: Diagnosis present

## 2024-05-30 DIAGNOSIS — M4627 Osteomyelitis of vertebra, lumbosacral region: Secondary | ICD-10-CM | POA: Diagnosis present

## 2024-05-30 DIAGNOSIS — M4647 Discitis, unspecified, lumbosacral region: Principal | ICD-10-CM | POA: Diagnosis present

## 2024-05-30 DIAGNOSIS — Z79899 Other long term (current) drug therapy: Secondary | ICD-10-CM | POA: Diagnosis not present

## 2024-05-30 DIAGNOSIS — M5416 Radiculopathy, lumbar region: Secondary | ICD-10-CM | POA: Diagnosis present

## 2024-05-30 DIAGNOSIS — Z8249 Family history of ischemic heart disease and other diseases of the circulatory system: Secondary | ICD-10-CM | POA: Diagnosis not present

## 2024-05-30 DIAGNOSIS — K219 Gastro-esophageal reflux disease without esophagitis: Secondary | ICD-10-CM | POA: Diagnosis present

## 2024-05-30 DIAGNOSIS — N939 Abnormal uterine and vaginal bleeding, unspecified: Secondary | ICD-10-CM | POA: Diagnosis present

## 2024-05-30 DIAGNOSIS — M4626 Osteomyelitis of vertebra, lumbar region: Secondary | ICD-10-CM | POA: Diagnosis present

## 2024-05-30 DIAGNOSIS — Z9049 Acquired absence of other specified parts of digestive tract: Secondary | ICD-10-CM | POA: Diagnosis not present

## 2024-05-30 DIAGNOSIS — Z818 Family history of other mental and behavioral disorders: Secondary | ICD-10-CM | POA: Diagnosis not present

## 2024-05-30 DIAGNOSIS — Z823 Family history of stroke: Secondary | ICD-10-CM

## 2024-05-30 DIAGNOSIS — G8929 Other chronic pain: Secondary | ICD-10-CM | POA: Diagnosis present

## 2024-05-30 DIAGNOSIS — E119 Type 2 diabetes mellitus without complications: Secondary | ICD-10-CM | POA: Diagnosis not present

## 2024-05-30 DIAGNOSIS — Y838 Other surgical procedures as the cause of abnormal reaction of the patient, or of later complication, without mention of misadventure at the time of the procedure: Secondary | ICD-10-CM | POA: Diagnosis present

## 2024-05-30 LAB — CBC WITH DIFFERENTIAL/PLATELET
Abs Immature Granulocytes: 0.02 K/uL (ref 0.00–0.07)
Basophils Absolute: 0.1 K/uL (ref 0.0–0.1)
Basophils Relative: 1 %
Eosinophils Absolute: 0.1 K/uL (ref 0.0–0.5)
Eosinophils Relative: 1 %
HCT: 35.4 % — ABNORMAL LOW (ref 36.0–46.0)
Hemoglobin: 10.5 g/dL — ABNORMAL LOW (ref 12.0–15.0)
Immature Granulocytes: 0 %
Lymphocytes Relative: 12 %
Lymphs Abs: 1.2 K/uL (ref 0.7–4.0)
MCH: 21.8 pg — ABNORMAL LOW (ref 26.0–34.0)
MCHC: 29.7 g/dL — ABNORMAL LOW (ref 30.0–36.0)
MCV: 73.4 fL — ABNORMAL LOW (ref 80.0–100.0)
Monocytes Absolute: 0.5 K/uL (ref 0.1–1.0)
Monocytes Relative: 5 %
Neutro Abs: 7.9 K/uL — ABNORMAL HIGH (ref 1.7–7.7)
Neutrophils Relative %: 81 %
Platelets: 454 K/uL — ABNORMAL HIGH (ref 150–400)
RBC: 4.82 MIL/uL (ref 3.87–5.11)
RDW: 18.6 % — ABNORMAL HIGH (ref 11.5–15.5)
WBC: 9.7 K/uL (ref 4.0–10.5)
nRBC: 0 % (ref 0.0–0.2)

## 2024-05-30 LAB — HCG, SERUM, QUALITATIVE: Preg, Serum: NEGATIVE

## 2024-05-30 LAB — COMPREHENSIVE METABOLIC PANEL WITH GFR
ALT: 20 U/L (ref 0–44)
AST: 21 U/L (ref 15–41)
Albumin: 4 g/dL (ref 3.5–5.0)
Alkaline Phosphatase: 44 U/L (ref 38–126)
Anion gap: 9 (ref 5–15)
BUN: 17 mg/dL (ref 6–20)
CO2: 23 mmol/L (ref 22–32)
Calcium: 9.5 mg/dL (ref 8.9–10.3)
Chloride: 108 mmol/L (ref 98–111)
Creatinine, Ser: 0.81 mg/dL (ref 0.44–1.00)
GFR, Estimated: >60 mL/min (ref >60)
Glucose, Bld: 89 mg/dL (ref 70–99)
Potassium: 4.2 mmol/L (ref 3.5–5.1)
Sodium: 139 mmol/L (ref 135–145)
Total Bilirubin: 0.4 mg/dL (ref 0.0–1.2)
Total Protein: 7.3 g/dL (ref 6.5–8.1)

## 2024-05-30 LAB — TSH: TSH: 2.57 u[IU]/mL (ref 0.350–4.500)

## 2024-05-30 LAB — TROPONIN T, HIGH SENSITIVITY: Troponin T High Sensitivity: <15 ng/L (ref 0–19)

## 2024-05-30 MED ORDER — LACTATED RINGERS IV BOLUS
1000.0000 mL | Freq: Once | INTRAVENOUS | Status: AC
  Administered 2024-05-30: 1000 mL via INTRAVENOUS

## 2024-05-30 MED ORDER — SENNOSIDES-DOCUSATE SODIUM 8.6-50 MG PO TABS: 1.0000 | ORAL_TABLET | Freq: Every evening | ORAL | Status: DC | PRN

## 2024-05-30 MED ORDER — GABAPENTIN 300 MG PO CAPS
300.0000 mg | ORAL_CAPSULE | Freq: Every day | ORAL | Status: DC
Start: 2024-05-30 — End: 2024-06-01
  Administered 2024-05-30 – 2024-05-31 (×2): 300 mg via ORAL
  Filled 2024-05-30 (×2): qty 1

## 2024-05-30 MED ORDER — ONDANSETRON HCL 4 MG/2ML IJ SOLN
4.0000 mg | Freq: Once | INTRAMUSCULAR | Status: AC
  Administered 2024-05-30: 4 mg via INTRAVENOUS
  Filled 2024-05-30: qty 2

## 2024-05-30 MED ORDER — METHOCARBAMOL 1000 MG/10ML IJ SOLN
500.0000 mg | Freq: Four times a day (QID) | INTRAMUSCULAR | Status: DC | PRN
  Administered 2024-05-31 – 2024-06-02 (×4): 500 mg via INTRAVENOUS
  Filled 2024-05-30 (×4): qty 10

## 2024-05-30 MED ORDER — SODIUM CHLORIDE 0.9 % IV SOLN
2.0000 g | Freq: Once | INTRAVENOUS | Status: AC
  Administered 2024-05-30: 2 g via INTRAVENOUS
  Filled 2024-05-30: qty 12.5

## 2024-05-30 MED ORDER — HYDROMORPHONE HCL 1 MG/ML IJ SOLN
0.5000 mg | INTRAMUSCULAR | Status: DC | PRN
  Administered 2024-05-31 (×3): 0.5 mg via INTRAVENOUS
  Filled 2024-05-30 (×3): qty 0.5

## 2024-05-30 MED ORDER — VANCOMYCIN HCL 750 MG/150ML IV SOLN
750.0000 mg | Freq: Two times a day (BID) | INTRAVENOUS | Status: DC
  Administered 2024-05-31 – 2024-06-01 (×3): 750 mg via INTRAVENOUS
  Filled 2024-05-30 (×3): qty 150

## 2024-05-30 MED ORDER — PROCHLORPERAZINE EDISYLATE 10 MG/2ML IJ SOLN
5.0000 mg | Freq: Four times a day (QID) | INTRAMUSCULAR | Status: DC | PRN
Start: 2024-05-30 — End: 2024-06-04

## 2024-05-30 MED ORDER — ACETAMINOPHEN 650 MG RE SUPP: 650.0000 mg | Freq: Four times a day (QID) | RECTAL | Status: DC | PRN

## 2024-05-30 MED ORDER — OXYCODONE HCL 5 MG PO TABS
5.0000 mg | ORAL_TABLET | ORAL | Status: DC | PRN
  Administered 2024-05-31 – 2024-06-01 (×3): 5 mg via ORAL
  Filled 2024-05-30 (×3): qty 1

## 2024-05-30 MED ORDER — MORPHINE SULFATE (PF) 4 MG/ML IV SOLN
4.0000 mg | Freq: Once | INTRAVENOUS | Status: AC
  Administered 2024-05-30: 4 mg via INTRAVENOUS
  Filled 2024-05-30: qty 1

## 2024-05-30 MED ORDER — VANCOMYCIN HCL 2000 MG/400ML IV SOLN
2000.0000 mg | Freq: Once | INTRAVENOUS | Status: AC
  Administered 2024-05-30: 2000 mg via INTRAVENOUS
  Filled 2024-05-30: qty 400

## 2024-05-30 MED ORDER — GADOBUTROL 1 MMOL/ML IV SOLN
9.0000 mL | Freq: Once | INTRAVENOUS | Status: AC | PRN
  Administered 2024-05-30: 9 mL via INTRAVENOUS

## 2024-05-30 MED ORDER — SODIUM CHLORIDE 0.9 % IV SOLN
2.0000 g | Freq: Three times a day (TID) | INTRAVENOUS | Status: DC
  Administered 2024-05-31 – 2024-06-01 (×4): 2 g via INTRAVENOUS
  Filled 2024-05-30 (×4): qty 12.5

## 2024-05-30 MED ORDER — OXYCODONE HCL 5 MG PO TABS
5.0000 mg | ORAL_TABLET | Freq: Once | ORAL | Status: AC
  Administered 2024-05-30: 5 mg via ORAL
  Filled 2024-05-30: qty 1

## 2024-05-30 MED ORDER — ACETAMINOPHEN 325 MG PO TABS
650.0000 mg | ORAL_TABLET | Freq: Four times a day (QID) | ORAL | Status: DC | PRN
  Administered 2024-05-31: 650 mg via ORAL
  Filled 2024-05-30: qty 2

## 2024-05-30 NOTE — ED Provider Notes (Signed)
 [place] EMERGENCY DEPARTMENT AT [facility] Provider Note  CSN: [id number] Arrival date & time: 05/30/24 1514  Chief Complaint(s) Back Pain  HPI Laura Mejia is a 38 y.o. female who is here today for worsening back pain.  Patient has been dealing with back pain since July, has been following with her PCP and orthopedics.  She has had injections in her back.  She had an MRI done of her hip earlier this week which showed questionable discitis at L5-S1.  She reports that she has been feeling faint at home, and has had episodes where she has felt like she is about to lose consciousness.  She denies fever, but has at times felt as though she has had chills.   Past Medical History 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)    Depression    Diverticulitis    Gallbladder problem    GERD (gastroesophageal reflux disease)    Heart murmur    Palpitations    Vitamin D  deficiency    Patient Active Problem List   Diagnosis Date Noted   Iron deficiency anemia 11/30/2023   Vitamin D  deficiency 11/30/2023   Insulin  resistance 11/30/2023   Essential (primary) hypertension 11/16/2023   Class 2 obesity due to excess calories without serious comorbidity with body mass index (BMI) of 37.0 to 37.9 in adult 11/16/2023   Suspected sleep apnea 11/16/2023   Chest pain of uncertain etiology 04/10/2021   PAC (premature atrial contraction) 10/20/2011   Palpitations 09/22/2011   Murmur, cardiac 09/22/2011   Home Medication(s) Prior to Admission medications   Medication Sig Start Date End Date Taking? Authorizing Provider  amLODipine  (NORVASC ) 2.5 MG tablet Take 1 tablet (2.5 mg total) by mouth daily. 04/25/24   Francyne Romano, MD  cyclobenzaprine  (FLEXERIL ) 10 MG tablet Take 1 tablet (10 mg total) by mouth 3 (three) times daily as needed for muscle  spasms. 05/10/24   Gretta Bertrum ORN, PA-C  gabapentin (NEURONTIN) 300 MG capsule Take 1 capsule (300 mg total) by mouth at bedtime. 05/10/24   Gretta Bertrum ORN, PA-C  metFORMIN  (GLUCOPHAGE -XR) 500 MG 24 hr tablet Take 1 tablet (500 mg total) by mouth daily with breakfast. 04/25/24   Francyne Romano, MD  Vitamin D , Ergocalciferol , (DRISDOL ) 1.25 MG (50000 UNIT) CAPS capsule Take 1 capsule (50,000 Units total) by mouth every 7 (seven) days. 11/30/23   Francyne Romano, MD                                                                                                                                    Past Surgical History Past Surgical History:  Procedure Laterality Date   CESAREAN SECTION     CHOLECYSTECTOMY     Family History Family History  Problem Relation Age of Onset   Hypertension Mother    Stroke Mother    Depression Mother    Anxiety disorder Mother    Bipolar disorder Mother    Obesity Mother    Bone cancer Mother        10/2023 dx   Hypertension Father    Diabetes Father    Heart disease Father    Colon cancer Neg Hx    Rectal cancer Neg Hx    Stomach cancer Neg Hx    Esophageal cancer Neg Hx    Pancreatic cancer Neg Hx    Liver cancer Neg Hx     Social History Social History   Tobacco Use   Smoking status: Never   Smokeless tobacco: Never  Vaping Use   Vaping status: Never Used  Substance Use Topics   Alcohol use: Not Currently    Comment: occ   Drug use: No   Allergies Patient has no known allergies.  Review of Systems Review of Systems  Physical Exam Vital Signs  I have reviewed the triage vital signs BP 138/80   Pulse (!) 102   Temp 98.2 F (36.8 C)   Resp 20   Ht 5' 2 (1.575 m)   Wt 88 kg   SpO2 100%   BMI 35.48 kg/m   Physical Exam Vitals and nursing note reviewed.  Constitutional:      Appearance: She is not toxic-appearing.  Pulmonary:     Effort: Pulmonary effort is normal.  Abdominal:     General: Abdomen is flat.   Musculoskeletal:        General: No swelling or deformity. Normal range of motion.     Cervical back: Normal range of motion.  Neurological:     General: No focal deficit present.     Mental Status: She is alert.     Comments: Normal sensation of bilateral lower extremities.  5 5 strength with plantar and dorsi flexion.     ED Results and Treatments Labs (all labs ordered are listed, but only abnormal results are displayed) Labs Reviewed  CBC WITH DIFFERENTIAL/PLATELET - Abnormal; Notable for the following components:      Result Value   Hemoglobin 10.5 (*)    HCT 35.4 (*)    MCV 73.4 (*)    MCH 21.8 (*)    MCHC 29.7 (*)    RDW 18.6 (*)    Platelets 454 (*)    Neutro Abs 7.9 (*)    All other components within normal limits  CULTURE, BLOOD (ROUTINE X 2)  CULTURE, BLOOD (ROUTINE X 2)  TSH  HCG, SERUM, QUALITATIVE  COMPREHENSIVE METABOLIC PANEL WITH GFR  TROPONIN T, HIGH SENSITIVITY  TROPONIN T, HIGH SENSITIVITY                                                                                                                          Radiology MR Lumbar Spine W Wo Contrast Result Date: 05/30/2024 EXAM:  MRI Lumbar Spine 05/30/2024 06:17:38 PM TECHNIQUE: Multiplanar multisequence MRI of the lumbar spine was performed with and without the administration of 9mL gadobutrol  (GADAVIST ) 1 MMOL/ML IV. COMPARISON: MRI lumbar spine 02/17/2024 CLINICAL HISTORY: Lumbar radiculopathy, infection suspected, positive xray/CT FINDINGS: BONES AND ALIGNMENT: Normal alignment. Interval development of extensive edema within the L5-S1 disc with adjacent endplate erosive change and enhancement, compatible with discitis/osteomyelitis. SPINAL CORD: The conus terminates normally. SOFT TISSUES: Mild paraspinal edema at L5-S1.  No discrete abscess. L1-L2: No significant disc herniation. No spinal canal stenosis or neural foraminal narrowing. L2-L3: No significant disc herniation. No spinal canal stenosis or  neural foraminal narrowing. L3-L4: No significant disc herniation. No spinal canal stenosis or neural foraminal narrowing. L4-L5: Small disc bulge and annular fissure. Similar mild subarticular recess stenosis. No significant canal or foraminal stenosis. L5-S1: Interval development of extensive edema within the L5-S1 disc with adjacent endplate erosive change and enhancement, compatible with discitis/osteomyelitis. Slight (3 mm thick) ventral epidural enhancement is suspicious for early epidural phlegmon. Small central disc protrusion. Canal is patent. IMPRESSION: 1. Findings compatible with L5-S1 discitis/osteomyelitis, detailed above. 2. Slight (3 mm thick) ventral epidural enhancement is suspicious for early epidural phlegmon. 3. Mild paraspinal edema/cellulitis at L5-S1. No discrete, drainable fluid collection. Electronically signed by: Gilmore Molt MD 05/30/2024 07:36 PM EST RP Workstation: [id number]    Pertinent labs & imaging results that were available during my care of the patient were reviewed by me and considered in my medical decision making (see MDM for details).  Medications Ordered in ED Medications  oxyCODONE  (Oxy IR/ROXICODONE ) immediate release tablet 5 mg (has no administration in time range)  vancomycin (VANCOREADY) IVPB 2000 mg/400 mL (2,000 mg Intravenous New Bag/Given 05/30/24 2018)  lactated ringers bolus 1,000 mL (0 mLs Intravenous Stopped 05/30/24 1959)  gadobutrol  (GADAVIST ) 1 MMOL/ML injection 9 mL (9 mLs Intravenous Contrast Given 05/30/24 1817)  oxyCODONE  (Oxy IR/ROXICODONE ) immediate release tablet 5 mg (5 mg Oral Given 05/30/24 1831)  ceFEPIme (MAXIPIME) 2 g in sodium chloride  0.9 % 100 mL IVPB (0 g Intravenous Stopped 05/30/24 2041)  morphine  (PF) 4 MG/ML injection 4 mg (4 mg Intravenous Given 05/30/24 2007)  ondansetron  (ZOFRAN ) injection 4 mg (4 mg Intravenous Given 05/30/24 2007)                                                                                                                                      Procedures Procedures  (including critical care time)  Medical Decision Making / ED Course   This patient presents to the ED for concern of concerning MRI findings for discitis, this involves an extensive number of treatment options, and is a complaint that carries with it a high risk of complications and morbidity.  The differential diagnosis includes discitis, chronic back pain, sciatica back pain, degenerative disc disease.  MDM: Patient had MRI done in July which was negative for discitis, however on recent MRI  there is concern.  Patient has been feeling unwell, endorses near syncopal episodes.  Will provide her with some fluids, check blood work, EKG and TSH.  Will obtain dedicated lumbar MRI.  No neurological deficits on exam.  Reassessment 7:45 PM-patient with discitis, question of developing phlegmon.  Placed neurosurgery consult.  Have ordered cefepime and vancomycin for the patient.  She will require admission for analgesia, will also allow her to be seen by neurosurgery and infectious diseases.  Unclear for this as patient is nondiabetic, no history of IV drug use, and had a negative MRI in July.  Reassessment 8:45 PM-spoke with Lauraine Pickle from neurosurgery.  Do not recommend any neurosurgical intervention at this time.  Did recommend antibiotic treatment, consider IR sampling.  They will follow.  Will admit patient to hospitalist.  Patient did have some T wave inversions in her lateral precordial leads.  Initial troponin not elevated.   Additional history obtained:  -External records from outside source obtained and reviewed including: Chart review including previous notes, labs, imaging, consultation notes   Lab Tests: -I ordered, reviewed, and interpreted labs.   The pertinent results include:   Labs Reviewed  CBC WITH DIFFERENTIAL/PLATELET - Abnormal; Notable for the following components:      Result Value   Hemoglobin 10.5  (*)    HCT 35.4 (*)    MCV 73.4 (*)    MCH 21.8 (*)    MCHC 29.7 (*)    RDW 18.6 (*)    Platelets 454 (*)    Neutro Abs 7.9 (*)    All other components within normal limits  CULTURE, BLOOD (ROUTINE X 2)  CULTURE, BLOOD (ROUTINE X 2)  TSH  HCG, SERUM, QUALITATIVE  COMPREHENSIVE METABOLIC PANEL WITH GFR  TROPONIN T, HIGH SENSITIVITY  TROPONIN T, HIGH SENSITIVITY      EKG patient with new T wave inversions in the lateral precordial leads.  EKG Interpretation Date/Time:    Ventricular Rate:    PR Interval:    QRS Duration:    QT Interval:    QTC Calculation:   R Axis:      Text Interpretation:           Imaging Studies ordered: I ordered imaging studies including MRI lumbar spine I independently visualized and interpreted imaging. I agree with the radiologist interpretation   Medicines ordered and prescription drug management: Meds ordered this encounter  Medications   lactated ringers bolus 1,000 mL   gadobutrol  (GADAVIST ) 1 MMOL/ML injection 9 mL   oxyCODONE  (Oxy IR/ROXICODONE ) immediate release tablet 5 mg    Refill:  0   oxyCODONE  (Oxy IR/ROXICODONE ) immediate release tablet 5 mg    Refill:  0   ceFEPIme (MAXIPIME) 2 g in sodium chloride  0.9 % 100 mL IVPB    Antibiotic Indication::   Osteomyelitis   vancomycin (VANCOREADY) IVPB 2000 mg/400 mL    Indication::   Osteomyelitis   morphine  (PF) 4 MG/ML injection 4 mg   ondansetron  (ZOFRAN ) injection 4 mg    -I have reviewed the patients home medicines and have made adjustments as needed   Cardiac Monitoring: The patient was maintained on a cardiac monitor.  I personally viewed and interpreted the cardiac monitored which showed an underlying rhythm of: Normal sinus rhythm  Social Determinants of Health:  Factors impacting patients care include: Lack of access to primary care   Reevaluation: After the interventions noted above, I reevaluated the patient and found that they have :improved  Co morbidities  that complicate the patient evaluation  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)    Depression    Diverticulitis    Gallbladder problem    GERD (gastroesophageal reflux disease)    Heart murmur    Palpitations    Vitamin D  deficiency         Final Clinical Impression(s) / ED Diagnoses Final diagnoses:  Discitis of lumbosacral region     @PCDICTATION @    Mannie Pac T, DO 05/30/24 2050

## 2024-05-30 NOTE — ED Notes (Signed)
 US  PIV placed L AC, 20g 1.88

## 2024-05-30 NOTE — Progress Notes (Signed)
 Pharmacy Antibiotic Note  Laura Mejia is a 37 y.o. female admitted on 05/30/2024 with lumbar discitis.  Pharmacy has been consulted for Vancomycin and Cefepime dosing.  Plan: Cefepime 2g IV q8h Vancomycin 750 mg IV Q 12 hrs. Goal AUC 400-550.  Expected AUC: 519.6  SCr used: 0.81 Follow renal function F/u culture results & sensitivities  Height: 5' 2 (157.5 cm) Weight: 88 kg (194 lb) IBW/kg (Calculated) : 50.1  Temp (24hrs), Avg:98.1 F (36.7 C), Min:98 F (36.7 C), Max:98.2 F (36.8 C)  Recent Labs  Lab 05/30/24 1634 05/30/24 1729  WBC 9.7  --   CREATININE  --  0.81    Estimated Creatinine Clearance: 96.1 mL/min (by C-G formula based on SCr of 0.81 mg/dL).    No Known Allergies  Antimicrobials this admission: 11/3 Cefepime >>   11/3 Vancomycin >>    Dose adjustments this admission:    Microbiology results: 11/3 BCx:      Thank you for allowing pharmacy to be a part of this patient's care.  Karine Garn, PharmD 05/30/2024 11:57 PM

## 2024-05-30 NOTE — Progress Notes (Signed)
 ED Pharmacy Antibiotic Sign Off An antibiotic consult was received from an ED provider for vancomycin and cefepime per pharmacy dosing for osteomyelitis. A chart review was completed to assess appropriateness.   The following one time order(s) were placed:  Cefepime 2 g Vancomycin 2000 mg  Further antibiotic and/or antibiotic pharmacy consults should be ordered by the admitting provider if indicated.   Thank you for allowing pharmacy to be a part of this patient's care.   Stefano MARLA Bologna, PharmD, BCPS Clinical Pharmacist 05/30/2024 7:47 PM

## 2024-05-30 NOTE — ED Triage Notes (Signed)
 Patient BIB EMS for severe back pain, inability to stand.  Ptient reports L4-L5 spinal infection had infectioous disease appointment for today at [facility], states has been becoming weaker and weaker.

## 2024-05-31 ENCOUNTER — Inpatient Hospital Stay (HOSPITAL_COMMUNITY)

## 2024-05-31 ENCOUNTER — Encounter (HOSPITAL_COMMUNITY): Payer: Self-pay

## 2024-05-31 DIAGNOSIS — M4646 Discitis, unspecified, lumbar region: Secondary | ICD-10-CM | POA: Diagnosis not present

## 2024-05-31 HISTORY — PX: IR LUMBAR DISC ASPIRATION W/IMG GUIDE: IMG5306

## 2024-05-31 LAB — BASIC METABOLIC PANEL WITH GFR
Anion gap: 9 (ref 5–15)
BUN: 14 mg/dL (ref 6–20)
CO2: 21 mmol/L — ABNORMAL LOW (ref 22–32)
Calcium: 9.3 mg/dL (ref 8.9–10.3)
Chloride: 109 mmol/L (ref 98–111)
Creatinine, Ser: 0.74 mg/dL (ref 0.44–1.00)
GFR, Estimated: >60 mL/min (ref >60)
Glucose, Bld: 87 mg/dL (ref 70–99)
Potassium: 3.8 mmol/L (ref 3.5–5.1)
Sodium: 139 mmol/L (ref 135–145)

## 2024-05-31 LAB — CBC
HCT: 32.9 % — ABNORMAL LOW (ref 36.0–46.0)
Hemoglobin: 9.7 g/dL — ABNORMAL LOW (ref 12.0–15.0)
MCH: 21.7 pg — ABNORMAL LOW (ref 26.0–34.0)
MCHC: 29.5 g/dL — ABNORMAL LOW (ref 30.0–36.0)
MCV: 73.8 fL — ABNORMAL LOW (ref 80.0–100.0)
Platelets: 395 K/uL (ref 150–400)
RBC: 4.46 MIL/uL (ref 3.87–5.11)
RDW: 18.2 % — ABNORMAL HIGH (ref 11.5–15.5)
WBC: 8.1 K/uL (ref 4.0–10.5)
nRBC: 0 % (ref 0.0–0.2)

## 2024-05-31 LAB — HIV ANTIBODY (ROUTINE TESTING W REFLEX): HIV Screen 4th Generation wRfx: NONREACTIVE

## 2024-05-31 LAB — PROTIME-INR
INR: 1.1 (ref 0.8–1.2)
Prothrombin Time: 14.5 s (ref 11.4–15.2)

## 2024-05-31 MED ORDER — SODIUM CHLORIDE 0.9 % IV SOLN
INTRAVENOUS | Status: AC
Start: 2024-05-31 — End: 2024-05-31

## 2024-05-31 MED ORDER — FENTANYL CITRATE (PF) 100 MCG/2ML IJ SOLN
INTRAMUSCULAR | Status: AC | PRN
  Administered 2024-05-31: 50 ug via INTRAVENOUS

## 2024-05-31 MED ORDER — FENTANYL CITRATE (PF) 100 MCG/2ML IJ SOLN
INTRAMUSCULAR | Status: AC
  Filled 2024-05-31: qty 2

## 2024-05-31 MED ORDER — MIDAZOLAM HCL (PF) 2 MG/2ML IJ SOLN
INTRAMUSCULAR | Status: AC | PRN
  Administered 2024-05-31: 1 mg via INTRAVENOUS

## 2024-05-31 MED ORDER — MIDAZOLAM HCL 2 MG/2ML IJ SOLN
INTRAMUSCULAR | Status: AC
  Filled 2024-05-31: qty 2

## 2024-05-31 MED ORDER — LIDOCAINE HCL (PF) 1 % IJ SOLN
30.0000 mL | Freq: Once | INTRAMUSCULAR | Status: AC
  Administered 2024-05-31: 10 mL via INTRADERMAL

## 2024-05-31 MED ORDER — LIDOCAINE HCL (PF) 1 % IJ SOLN
INTRAMUSCULAR | Status: AC
  Filled 2024-05-31: qty 30

## 2024-05-31 MED ORDER — HYDROMORPHONE HCL 1 MG/ML IJ SOLN
1.0000 mg | INTRAMUSCULAR | Status: DC | PRN
  Administered 2024-05-31 – 2024-06-03 (×11): 1 mg via INTRAVENOUS
  Filled 2024-05-31 (×11): qty 1

## 2024-05-31 NOTE — Consult Note (Signed)
 Chief Complaint: Low back pain with radiation to right>left lower extremity, L5-S1 discitis/osteomyelitis; referred for image guided L5-S1 disc space aspiration  Referring Provider(s): Opyd,T  Supervising Physician: Jennefer Rover  Patient Status: [facility] - In-pt  History of Present Illness: Laura Mejia is a 38 y.o. female with PMH sig for anxiety, depression, diverticulitis, GERD, vit D def , lumbar radiculopathy who was admitted to [facility] on 11/3 with worsening low back pain. Patient reports that she has been experiencing low back pain with radiation down both legs that began in July 2025.  Pain has worsened and she is having difficulty standing due to severe pain. She was given a lumbar epidural steroid injection with fluoroscopic guidance on 04/13/2024.  She was experiencing subjective fever and chills a couple weeks ago but none since.  She denies any history of diabetes or injection drug use.  There are no red/hot/swollen joints . MRI L spine yesterday revealed:  1. Findings compatible with L5-S1 discitis/osteomyelitis, detailed above. 2. Slight (3 mm thick) ventral epidural enhancement is suspicious for early epidural phlegmon. 3. Mild paraspinal edema/cellulitis at L5-S1. No discrete, drainable fluid collection.   Neurosurgery was consulted by the ED physician and recommended starting empiric antibiotics and consulting IR for tissue sampling.     Patient is Full Code  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)    Depression    Diverticulitis    Gallbladder problem    GERD (gastroesophageal reflux disease)    Heart murmur    Palpitations    Vitamin D  deficiency     Past Surgical History:  Procedure Laterality Date   CESAREAN SECTION     CHOLECYSTECTOMY      Allergies: Patient has no known allergies.  Medications: Prior to  Admission medications   Medication Sig Start Date End Date Taking? Authorizing Provider  cyclobenzaprine  (FLEXERIL ) 10 MG tablet Take 1 tablet (10 mg total) by mouth 3 (three) times daily as needed for muscle spasms. 05/10/24  Yes Gretta Bertrum ORN, PA-C  gabapentin (NEURONTIN) 300 MG capsule Take 1 capsule (300 mg total) by mouth at bedtime. 05/10/24  Yes Gretta Bertrum ORN, PA-C  amLODipine  (NORVASC ) 2.5 MG tablet Take 1 tablet (2.5 mg total) by mouth daily. Patient not taking: Reported on 05/31/2024 04/25/24   Francyne Romano, MD  amoxicillin -clavulanate (AUGMENTIN ) 500-125 MG tablet Take 500 mg by mouth in the morning and at bedtime. Patient not taking: Reported on 05/31/2024 01/25/24   [provider]  metFORMIN  (GLUCOPHAGE -XR) 500 MG 24 hr tablet Take 1 tablet (500 mg total) by mouth daily with breakfast. Patient not taking: Reported on 05/31/2024 04/25/24   Francyne Romano, MD  metroNIDAZOLE  (FLAGYL ) 500 MG tablet Take 500 mg by mouth 2 (two) times daily. Patient not taking: Reported on 05/31/2024 04/26/24   [provider]  predniSONE  (DELTASONE ) 10 MG tablet Take 10 mg by mouth as directed. 6 TABS X2 DAYS, THEN 5 TABS X 2DAYS, 4 TABS X2 DAYS, 3 TABS X2 DAYS, 2 TABS X2 DAYS, 1 TAB X2 DAYS Patient not taking: Reported on 05/31/2024    [provider]  Vitamin D , Ergocalciferol , (DRISDOL ) 1.25 MG (50000 UNIT) CAPS capsule Take 1 capsule (50,000 Units total) by mouth every 7 (seven) days. Patient not taking: Reported on 05/31/2024 11/30/23   Francyne Romano, MD  Family History  Problem Relation Age of Onset   Hypertension Mother    Stroke Mother    Depression Mother    Anxiety disorder Mother    Bipolar disorder Mother    Obesity Mother    Bone cancer Mother        10/2023 dx   Hypertension Father    Diabetes Father    Heart disease Father    Colon cancer Neg Hx    Rectal cancer Neg Hx    Stomach cancer Neg Hx    Esophageal cancer Neg Hx    Pancreatic  cancer Neg Hx    Liver cancer Neg Hx     Social History   Socioeconomic History   Marital status: Married    Spouse name: Mabel   Number of children: 2   Years of education: Not on file   Highest education level: Not on file  Occupational History   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   Occupation: account review specialist - [employer]  Tobacco Use   Smoking status: Never   Smokeless tobacco: Never  Vaping Use   Vaping status: Never Used  Substance and Sexual Activity   Alcohol use: Not Currently    Comment: occ   Drug use: No   Sexual activity: Not on file  Other Topics Concern   Not on file  Social History Narrative   Not on file   Social Drivers of Health   Financial Resource Strain: Not on file  Food Insecurity: No Food Insecurity (05/31/2024)   Hunger Vital Sign    Worried About Running Out of Food in the Last Year: Never true    Ran Out of Food in the Last Year: Never true  Transportation Needs: No Transportation Needs (05/31/2024)   PRAPARE - Administrator, Civil Service (Medical): No    Lack of Transportation (Non-Medical): No  Physical Activity: Not on file  Stress: Not on file  Social Connections: Not on file       Review of Systems; see above; currently denies fever,HA,CP,dyspnea, cough, abd pain,N/V or bleeding  Vital Signs: BP (!) 141/87 (BP Location: Right Arm)   Pulse 97   Temp 98.6 F (37 C)   Resp 18   Ht 5' 8 (1.727 m)   Wt 189 lb 9.5 oz (86 kg)   SpO2 97%   BMI 28.83 kg/m   Advance Care Plan: no documents on file    Physical Exam: awake/alert; chest- CTA bilat; heart- RRR; abd-soft,+BS,NT; no LE edema  Imaging: MR Lumbar Spine W Wo Contrast Result Date: 05/30/2024 EXAM: MRI Lumbar Spine 05/30/2024 06:17:38 PM TECHNIQUE: Multiplanar multisequence MRI of the lumbar spine was performed with and without the administration of 9mL gadobutrol  (GADAVIST ) 1 MMOL/ML IV. COMPARISON: MRI lumbar spine  02/17/2024 CLINICAL HISTORY: Lumbar radiculopathy, infection suspected, positive xray/CT FINDINGS: BONES AND ALIGNMENT: Normal alignment. Interval development of extensive edema within the L5-S1 disc with adjacent endplate erosive change and enhancement, compatible with discitis/osteomyelitis. SPINAL CORD: The conus terminates normally. SOFT TISSUES: Mild paraspinal edema at L5-S1.  No discrete abscess. L1-L2: No significant disc herniation. No spinal canal stenosis or neural foraminal narrowing. L2-L3: No significant disc herniation. No spinal canal stenosis or neural foraminal narrowing. L3-L4: No significant disc herniation. No spinal canal stenosis or neural foraminal narrowing. L4-L5: Small disc bulge and annular fissure. Similar mild subarticular recess stenosis. No significant canal or foraminal stenosis. L5-S1: Interval development of extensive edema within the  L5-S1 disc with adjacent endplate erosive change and enhancement, compatible with discitis/osteomyelitis. Slight (3 mm thick) ventral epidural enhancement is suspicious for early epidural phlegmon. Small central disc protrusion. Canal is patent. IMPRESSION: 1. Findings compatible with L5-S1 discitis/osteomyelitis, detailed above. 2. Slight (3 mm thick) ventral epidural enhancement is suspicious for early epidural phlegmon. 3. Mild paraspinal edema/cellulitis at L5-S1. No discrete, drainable fluid collection. Electronically signed by: Gilmore Molt MD 05/30/2024 07:36 PM EST RP Workstation: [id number]   MR Hip Left w/o contrast Result Date: 05/23/2024 CLINICAL DATA:  Hip pain, chronic, articular cartilage eval, xray done Bilateral hip pain with weakness for 3 months, worse on the left. No acute injury or prior relevant surgery. EXAM: MR OF THE LEFT HIP WITHOUT CONTRAST TECHNIQUE: Multiplanar, multisequence MR imaging was performed. No intravenous contrast was administered. COMPARISON:  Radiographs 05/09/2024 and 03/11/2024. MRI lumbar spine  02/17/2024. CT pelvis 11/11/2022. FINDINGS: Bones: There is no evidence of acute fracture, dislocation or femoral head osteonecrosis. Mildly decreased T1 marrow signal throughout the bones, similar to previous MRI. No aggressive osseous lesions are identified. Coronal images demonstrate new T2 hyperintensity within the L5-S1 disc with surrounding marrow and soft tissue edema, highly suspicious for discitis/osteomyelitis. The visualized sacroiliac joints and symphysis pubis appear normal. Articular cartilage and labrum Articular cartilage: No focal chondral defect or subchondral signal abnormality identified. Labrum: There is no gross labral tear or paralabral abnormality. Joint or bursal effusion Joint effusion: No significant hip joint effusion. Bursae: No focal periarticular fluid collection. Muscles and tendons Muscles and tendons: The visualized gluteus, hamstring and iliopsoas tendons appear normal. No focal muscular atrophy or edema. The piriformis muscles appear symmetric. Other findings Miscellaneous: Small to moderate free pelvic fluid, within physiologic limits. There is also presacral edema attributed to the discitis and osteomyelitis at L5-S1. No focal fluid collections identified on coronal imaging. Intrauterine device noted. IMPRESSION: 1. Findings are highly suspicious for discitis/osteomyelitis at L5-S1. Recommend dedicated lumbar MRI without and with contrast for further evaluation. 2. No acute osseous findings or significant arthropathic changes in the hips. 3. The hip muscles and tendons appear unremarkable. 4. These results will be called to the ordering clinician or representative by the Radiologist Assistant, and communication documented in the PACS or [employer]. Electronically Signed   By: Elsie Perone M.D.   On: 05/23/2024 09:14   XR HIP UNILAT W OR W/O PELVIS 2-3 VIEWS RIGHT Result Date: 05/09/2024 AP pelvis lateral view of the right hip: Bilateral hips well located.  Hip  joints overall well-maintained.  No acute fractures acute findings.  No bony abnormalities or signs of AVN.   Labs:  CBC: Recent Labs    02/17/24 0721 03/11/24 2016 05/30/24 1634 05/31/24 0319  WBC 6.5 12.5* 9.7 8.1  HGB 9.6* 9.7* 10.5* 9.7*  HCT 31.1* 33.7* 35.4* 32.9*  PLT 355 509* 454* 395    COAGS: No results for input(s): INR, APTT in the last 8760 hours.  BMP: Recent Labs    02/17/24 0721 03/11/24 2016 05/30/24 1729 05/31/24 0319  NA 137 139 139 139  K 3.5 4.4 4.2 3.8  CL 104 111 108 109  CO2 21* 20* 23 21*  GLUCOSE 112* 90 89 87  BUN 13 16 17 14   CALCIUM 8.8* 8.3* 9.5 9.3  CREATININE 0.83 0.75 0.81 0.74  GFRNONAA >60 >60 >60 >60    LIVER FUNCTION TESTS: Recent Labs    11/16/23 0950 02/17/24 0721 03/11/24 2016 05/30/24 1729  BILITOT <0.2 0.2 0.9 0.4  AST 21  28 22 21   ALT 25 18 18 20   ALKPHOS 60 47 49 44  PROT 6.7 7.1 6.9 7.3  ALBUMIN 4.3 3.9 3.3* 4.0    TUMOR MARKERS: No results for input(s): AFPTM, CEA, CA199, CHROMGRNA in the last 8760 hours.  Assessment and Plan: 38 y.o. female with PMH sig for anxiety, depression, diverticulitis, GERD, vit D def , lumbar radiculopathy who was admitted to [facility] on 11/3 with worsening low back pain. Patient reports that she has been experiencing low back pain with radiation down both legs that began in July 2025.  Pain has worsened and she is having difficulty standing due to severe pain. She was given a lumbar epidural steroid injection with fluoroscopic guidance on 04/13/2024.  She was experiencing subjective fever and chills a couple weeks ago but none since.  She denies any history of diabetes or injection drug use.  There are no red/hot/swollen joints . MRI L spine yesterday revealed:  1. Findings compatible with L5-S1 discitis/osteomyelitis, detailed above. 2. Slight (3 mm thick) ventral epidural enhancement is suspicious for early epidural phlegmon. 3. Mild paraspinal edema/cellulitis at L5-S1. No  discrete, drainable fluid collection.   Neurosurgery was consulted by the ED physician and recommended starting empiric antibiotics and consulting IR for tissue sampling; pt on IV vancomycin, cefepime. Imaging studies were reviewed by Dr. Jennefer. Plan is for image guided aspiration of L5-S1 disc space. Risks and benefits discussed with the patient including bleeding, infection, nerve injury ,damage to adjacent structures and sepsis.  All of the patient's questions were answered, patient is agreeable to proceed. Consent signed and in chart.  Procedure scheduled for today  Thank you for allowing our service to participate in Laura Mejia 's care.  Electronically Signed: D. Franky Rakers, PA-C   05/31/2024, 10:33 AM      I spent a total of  25 minutes   in face to face in clinical consultation, greater than 50% of which was counseling/coordinating care for image guided aspiration of L5-S1 disc space

## 2024-05-31 NOTE — Sedation Documentation (Signed)
 RN Marea Reasner pulled 4 mg Versed and 200 mcg Fentanyl  in IR room pysix. Pt. Received 3 mg Versed and 150 mcg Fentanyl  throughout the procedure. RN Bascom Biel wasted  1 mg Versed and 50 mcg Fentanyl  with Rosina Essex RN.

## 2024-05-31 NOTE — Progress Notes (Signed)
 PROGRESS NOTE    Laura Mejia  FMW:[id number] DOB: 1986-04-20 DOA: 05/30/2024 PCP: Patient, No Pcp Per   Brief Narrative:  Laura Mejia is a 38 y.o. female with medical history significant for hypertension, depression, anxiety, and lumbar radiculopathy who presents with worsening low back pain worsening since July 2025.  She had a recent epidural steroid injection on 04/13/2024 with worsening fevers and chills over the past few weeks despite prior treatment with Augmentin , Flagyl .  She denies any trauma or other recent infections.  Neurosurgery consulted at intake by ED recommending empiric antibiotics and IR consult for drain.  Hospitalist called for admission.   Assessment & Plan:   Principal Problem:   Septic discitis of lumbar region Active Problems:   Essential (primary) hypertension  Sepsis secondary to lumbar discitis/osteomyelitis, epidural phlegmon/abscess, paraspinal cellulitis, POA - Tachycardic, tachypneic with notable source of infection - Neurosurgery recommended starting empiric antibiotics and consulting IR for image-guided biopsy  -Unfortunately unable to obtain specimen or place drain at [facility] recommending transfer to [facility] for repeat attempt. - Continue vancomycin, cefepime - Discussed case with infectious disease, appreciate their assistance and will consult in the next 24 to 48 hours pending clinical course and cultures  Lumbar radiculopathy, POA - Appears to precede patient's current symptoms and abscess/phlegmon and infection - Appreciate neurosurgery insight recommendations, continue to follow clinically at this time  Hypertension Depression Anxiety - Stable, resume home medications as appropriate -per med rec patient is no longer on amlodipine , metformin  or any other chronic medications other than Flexeril  and gabapentin  DVT prophylaxis: SCDs Start: 05/30/24 2205 Code Status:   Code Status: Full Code Family Communication: None  present  Status is: Inpatient  Dispo: The patient is from: Home              Anticipated d/c is to: Home              Anticipated d/c date is: 48 to 72 hours              Patient currently not medically stable for discharge  Consultants:  Interventional radiology, Infectious disease; neurosurgery sidelined, no formal consult was placed  Procedures:  Percutaneous phlegmon drainage attempted 05/31/2024, unsuccessful  Antimicrobials:  Cefepime, vancomycin  Subjective: No acute issues or events overnight denies nausea vomiting diarrhea constipation headache fevers chills or chest pain.  Back pain ongoing as are her lower extremity paresthesias but minimally improved from prior  Objective: Vitals:   05/30/24 2231 05/31/24 0007 05/31/24 0010 05/31/24 0323  BP:  (!) 141/86  (!) 130/91  Pulse:  99  95  Resp:  15  16  Temp: 98 F (36.7 C) (!) 97.3 F (36.3 C)  97.9 F (36.6 C)  TempSrc:  Oral  Oral  SpO2:  99%  94%  Weight:   86 kg   Height:   5' 8 (1.727 m)     Intake/Output Summary (Last 24 hours) at 05/31/2024 0742 Last data filed at 05/31/2024 0600 Gross per 24 hour  Intake 248.29 ml  Output --  Net 248.29 ml   Filed Weights   05/30/24 2001 05/31/24 0010  Weight: 88 kg 86 kg    Examination:  General:  Pleasantly resting in bed, No acute distress. HEENT:  Normocephalic atraumatic.  Sclerae nonicteric, noninjected.  Extraocular movements intact bilaterally. Neck:  Without mass or deformity.  Trachea is midline. Lungs:  Clear to auscultate bilaterally without rhonchi, wheeze, or rales. Heart:  Regular rate and rhythm.  Without murmurs, rubs, or gallops. Abdomen:  Soft, nontender, nondistended.  Without guarding or rebound. Extremities: Without cyanosis, clubbing, edema, or obvious deformity.  Data Reviewed: I have personally reviewed following labs and imaging studies  CBC: Recent Labs  Lab 05/30/24 1634 05/31/24 0319  WBC 9.7 8.1  NEUTROABS 7.9*  --   HGB  10.5* 9.7*  HCT 35.4* 32.9*  MCV 73.4* 73.8*  PLT 454* 395   Basic Metabolic Panel: Recent Labs  Lab 05/30/24 1729 05/31/24 0319  NA 139 139  K 4.2 3.8  CL 108 109  CO2 23 21*  GLUCOSE 89 87  BUN 17 14  CREATININE 0.81 0.74  CALCIUM 9.5 9.3   GFR: Estimated Creatinine Clearance: 108.4 mL/min (by C-G formula based on SCr of 0.74 mg/dL). Liver Function Tests: Recent Labs  Lab 05/30/24 1729  AST 21  ALT 20  ALKPHOS 44  BILITOT 0.4  PROT 7.3  ALBUMIN 4.0   Thyroid Function Tests: Recent Labs    05/30/24 1635  TSH 2.570    Recent Results (from the past 240 hours)  Culture, blood (routine x 2)     Status: None (Preliminary result)   Collection Time: 05/30/24  4:33 PM   Specimen: BLOOD  Result Value Ref Range Status   Specimen Description   Final    BLOOD LEFT ANTECUBITAL Performed at [facility], [address], [place], [place] [address]    Special Requests   Final    BOTTLES DRAWN AEROBIC AND ANAEROBIC Blood Culture adequate volume Performed at [facility], [address] [place], [place] [address]    Culture   Final    NO GROWTH < 12 HOURS Performed at [facility], [address] [place], [place] [address]    Report Status PENDING  Incomplete  Culture, blood (routine x 2)     Status: None (Preliminary result)   Collection Time: 05/30/24  7:42 PM   Specimen: BLOOD  Result Value Ref Range Status   Specimen Description   Final    BLOOD LEFT ANTECUBITAL Performed at [facility], [address], [place], [place] [address]    Special Requests   Final    BOTTLES DRAWN AEROBIC AND ANAEROBIC Blood Culture results may not be optimal due to an inadequate volume of blood received in culture bottles Performed at [facility], [address], [place], [place] [address]    Culture   Final    NO GROWTH < 12 HOURS Performed at [facility], [address] [place],  [place] [address]    Report Status PENDING  Incomplete         Radiology Studies: MR Lumbar Spine W Wo Contrast Result Date: 05/30/2024 EXAM: MRI Lumbar Spine 05/30/2024 06:17:38 PM TECHNIQUE: Multiplanar multisequence MRI of the lumbar spine was performed with and without the administration of 9mL gadobutrol  (GADAVIST ) 1 MMOL/ML IV. COMPARISON: MRI lumbar spine 02/17/2024 CLINICAL HISTORY: Lumbar radiculopathy, infection suspected, positive xray/CT FINDINGS: BONES AND ALIGNMENT: Normal alignment. Interval development of extensive edema within the L5-S1 disc with adjacent endplate erosive change and enhancement, compatible with discitis/osteomyelitis. SPINAL CORD: The conus terminates normally. SOFT TISSUES: Mild paraspinal edema at L5-S1.  No discrete abscess. L1-L2: No significant disc herniation. No spinal canal stenosis or neural foraminal narrowing. L2-L3: No significant disc herniation. No spinal canal stenosis or neural foraminal narrowing. L3-L4: No significant disc herniation. No spinal canal stenosis or neural foraminal narrowing. L4-L5: Small disc  bulge and annular fissure. Similar mild subarticular recess stenosis. No significant canal or foraminal stenosis. L5-S1: Interval development of extensive edema within the L5-S1 disc with adjacent endplate erosive change and enhancement, compatible with discitis/osteomyelitis. Slight (3 mm thick) ventral epidural enhancement is suspicious for early epidural phlegmon. Small central disc protrusion. Canal is patent. IMPRESSION: 1. Findings compatible with L5-S1 discitis/osteomyelitis, detailed above. 2. Slight (3 mm thick) ventral epidural enhancement is suspicious for early epidural phlegmon. 3. Mild paraspinal edema/cellulitis at L5-S1. No discrete, drainable fluid collection. Electronically signed by: Gilmore Molt MD 05/30/2024 07:36 PM EST RP Workstation: [id number]        Scheduled Meds:  gabapentin  300 mg Oral QHS   Continuous Infusions:   sodium chloride  75 mL/hr at 05/31/24 0133   ceFEPime (MAXIPIME) IV 2 g (05/31/24 0355)   vancomycin       LOS: 1 day   Time spent:  Elsie JAYSON Montclair, DO [facility]  If 7PM-7AM, please contact night-coverage [email or web address]  05/31/2024, 7:42 AM

## 2024-05-31 NOTE — H&P (Signed)
 History and Physical    MAREESA Mejia FMW:[id number] DOB: Jun 18, 1986 DOA: 05/30/2024  PCP: Patient, No Pcp Per   Patient coming from: Home   Chief Complaint: Back pain   HPI: Laura Mejia is a 38 y.o. female with medical history significant for hypertension, depression, anxiety, and lumbar radiculopathy who presents with worsening low back pain.  Patient reports that she has been experiencing low back pain with radiation down both legs that began in July 2025.  Pain has worsened and she is having difficulty standing due to severe pain. She was given a lumbar epidural steroid injection with fluoroscopic guidance on 04/13/2024.  She was experiencing subjective fever and chills a couple weeks ago but none since.  She denies any history of diabetes or injection drug use.  There are no red/hot/swollen joints.  ED Course: Upon arrival to the ED, patient is found to be afebrile and saturating well on room air with mild tachycardia and stable BP.  Labs are most notable for normal CMP, normal WBC, hemoglobin 10.5, and platelets 1 54,000.  MRI is concerning for L5-S1 discitis/osteomyelitis with ventral epidural phlegmon and paraspinal cellulitis without drainable collection.  Neurosurgery was consulted by the ED physician and recommended starting empiric antibiotics and consulting IR for tissue sampling.  Neurosurgery indicated that the patient can remain at [facility].  Blood cultures were collected and the patient was given a liter of LR, vancomycin, cefepime, oxycodone , morphine , and Zofran  in the ED.  Review of Systems:  All other systems reviewed and apart from HPI, are negative.  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)    Depression    Diverticulitis    Gallbladder problem    GERD (gastroesophageal reflux disease)    Heart murmur     Palpitations    Vitamin D  deficiency     Past Surgical History:  Procedure Laterality Date   CESAREAN SECTION     CHOLECYSTECTOMY      Social History:   reports that she has never smoked. She has never used smokeless tobacco. She reports that she does not currently use alcohol. She reports that she does not use drugs.  No Known Allergies  Family History  Problem Relation Age of Onset   Hypertension Mother    Stroke Mother    Depression Mother    Anxiety disorder Mother    Bipolar disorder Mother    Obesity Mother    Bone cancer Mother        10/2023 dx   Hypertension Father    Diabetes Father    Heart disease Father    Colon cancer Neg Hx    Rectal cancer Neg Hx    Stomach cancer Neg Hx    Esophageal cancer Neg Hx    Pancreatic cancer Neg Hx    Liver cancer Neg Hx      Prior to Admission medications   Medication Sig Start Date End Date Taking? Authorizing Provider  amoxicillin -clavulanate (AUGMENTIN ) 500-125 MG tablet Take 500 mg by mouth in the morning and at bedtime. 01/25/24  Yes [provider]  metroNIDAZOLE  (FLAGYL ) 500 MG tablet Take 500 mg by mouth 2 (two) times daily. 04/26/24  Yes [provider]  amLODipine  (NORVASC ) 2.5 MG tablet Take 1 tablet (2.5 mg total) by mouth daily. 04/25/24   Francyne,  Lucas, MD  cyclobenzaprine  (FLEXERIL ) 10 MG tablet Take 1 tablet (10 mg total) by mouth 3 (three) times daily as needed for muscle spasms. 05/10/24   Gretta Bertrum ORN, PA-C  gabapentin (NEURONTIN) 300 MG capsule Take 1 capsule (300 mg total) by mouth at bedtime. 05/10/24   Gretta Bertrum ORN, PA-C  metFORMIN  (GLUCOPHAGE -XR) 500 MG 24 hr tablet Take 1 tablet (500 mg total) by mouth daily with breakfast. 04/25/24   Francyne Lucas, MD  predniSONE  (DELTASONE ) 10 MG tablet Take 10 mg by mouth as directed. 6 TABS X2 DAYS, THEN 5 TABS X 2DAYS, 4 TABS X2 DAYS, 3 TABS X2 DAYS, 2 TABS X2 DAYS, 1 TAB X2 DAYS    [provider]  Vitamin D ,  Ergocalciferol , (DRISDOL ) 1.25 MG (50000 UNIT) CAPS capsule Take 1 capsule (50,000 Units total) by mouth every 7 (seven) days. 11/30/23   Francyne Lucas, MD    Physical Exam: Vitals:   05/30/24 2220 05/30/24 2231 05/31/24 0007 05/31/24 0010  BP: 132/81  (!) 141/86   Pulse: 91  99   Resp: 18  15   Temp:  98 F (36.7 C) (!) 97.3 F (36.3 C)   TempSrc:   Oral   SpO2: 96%  99%   Weight:    86 kg  Height:    5' 8 (1.727 m)     Constitutional: NAD, calm  Eyes: PERTLA, lids and conjunctivae normal ENMT: Mucous membranes are moist. Posterior pharynx clear of any exudate or lesions.   Neck: supple, no masses  Respiratory: no wheezing, no crackles. No accessory muscle use.  Cardiovascular: S1 & S2 heard, regular rate and rhythm. No significant JVD. Abdomen: No tenderness, soft. Bowel sounds active.  Musculoskeletal: no clubbing / cyanosis. No joint deformity upper and lower extremities.   Skin: no significant rashes, lesions, ulcers. Warm, dry, well-perfused. Neurologic: CN 2-12 grossly intact. Sensation to light touch intact. Strength 5/5 in all 4 limbs. Alert and oriented.  Psychiatric: Pleasant. Cooperative.    Labs and Imaging on Admission: I have personally reviewed following labs and imaging studies  CBC: Recent Labs  Lab 05/30/24 1634  WBC 9.7  NEUTROABS 7.9*  HGB 10.5*  HCT 35.4*  MCV 73.4*  PLT 454*   Basic Metabolic Panel: Recent Labs  Lab 05/30/24 1729  NA 139  K 4.2  CL 108  CO2 23  GLUCOSE 89  BUN 17  CREATININE 0.81  CALCIUM 9.5   GFR: Estimated Creatinine Clearance: 107 mL/min (by C-G formula based on SCr of 0.81 mg/dL). Liver Function Tests: Recent Labs  Lab 05/30/24 1729  AST 21  ALT 20  ALKPHOS 44  BILITOT 0.4  PROT 7.3  ALBUMIN 4.0   No results for input(s): LIPASE, AMYLASE in the last 168 hours. No results for input(s): AMMONIA in the last 168 hours. Coagulation Profile: No results for input(s): INR, PROTIME in the last  168 hours. Cardiac Enzymes: No results for input(s): CKTOTAL, CKMB, CKMBINDEX, TROPONINI in the last 168 hours. BNP (last 3 results) No results for input(s): PROBNP in the last 8760 hours. HbA1C: No results for input(s): HGBA1C in the last 72 hours. CBG: No results for input(s): GLUCAP in the last 168 hours. Lipid Profile: No results for input(s): CHOL, HDL, LDLCALC, TRIG, CHOLHDL, LDLDIRECT in the last 72 hours. Thyroid Function Tests: Recent Labs    05/30/24 1635  TSH 2.570   Anemia Panel: No results for input(s): VITAMINB12, FOLATE, FERRITIN, TIBC, IRON, RETICCTPCT in the last 72 hours. Urine  analysis:    Component Value Date/Time   COLORURINE YELLOW 02/17/2024 0706   APPEARANCEUR HAZY (A) 02/17/2024 0706   LABSPEC 1.015 02/17/2024 0706   PHURINE 6.5 02/17/2024 0706   GLUCOSEU NEGATIVE 02/17/2024 0706   HGBUR LARGE (A) 02/17/2024 0706   BILIRUBINUR NEGATIVE 02/17/2024 0706   KETONESUR 15 (A) 02/17/2024 0706   PROTEINUR 30 (A) 02/17/2024 0706   UROBILINOGEN 0.2 03/03/2007 1723   NITRITE NEGATIVE 02/17/2024 0706   LEUKOCYTESUR TRACE (A) 02/17/2024 0706   Sepsis Labs: @LABRCNTIP (procalcitonin:4,lacticidven:4) )No results found for this or any previous visit (from the past 240 hours).   Radiological Exams on Admission: MR Lumbar Spine W Wo Contrast Result Date: 05/30/2024 EXAM: MRI Lumbar Spine 05/30/2024 06:17:38 PM TECHNIQUE: Multiplanar multisequence MRI of the lumbar spine was performed with and without the administration of 9mL gadobutrol  (GADAVIST ) 1 MMOL/ML IV. COMPARISON: MRI lumbar spine 02/17/2024 CLINICAL HISTORY: Lumbar radiculopathy, infection suspected, positive xray/CT FINDINGS: BONES AND ALIGNMENT: Normal alignment. Interval development of extensive edema within the L5-S1 disc with adjacent endplate erosive change and enhancement, compatible with discitis/osteomyelitis. SPINAL CORD: The conus terminates normally. SOFT  TISSUES: Mild paraspinal edema at L5-S1.  No discrete abscess. L1-L2: No significant disc herniation. No spinal canal stenosis or neural foraminal narrowing. L2-L3: No significant disc herniation. No spinal canal stenosis or neural foraminal narrowing. L3-L4: No significant disc herniation. No spinal canal stenosis or neural foraminal narrowing. L4-L5: Small disc bulge and annular fissure. Similar mild subarticular recess stenosis. No significant canal or foraminal stenosis. L5-S1: Interval development of extensive edema within the L5-S1 disc with adjacent endplate erosive change and enhancement, compatible with discitis/osteomyelitis. Slight (3 mm thick) ventral epidural enhancement is suspicious for early epidural phlegmon. Small central disc protrusion. Canal is patent. IMPRESSION: 1. Findings compatible with L5-S1 discitis/osteomyelitis, detailed above. 2. Slight (3 mm thick) ventral epidural enhancement is suspicious for early epidural phlegmon. 3. Mild paraspinal edema/cellulitis at L5-S1. No discrete, drainable fluid collection. Electronically signed by: Gilmore Molt MD 05/30/2024 07:36 PM EST RP Workstation: [id number]    Assessment/Plan   1. Lumbar discitis/osteomyelitis, epidural phlegmon, paraspinal cellulitis  - Neurosurgery recommended starting empiric antibiotics and consulting IR for image-guided biopsy  - Blood cultures were collected in the ED and vancomycin and cefepime were started  - Consult IR, continue vancomycin and cefepime     DVT prophylaxis: SCDs  Code Status: Full  Level of Care: Level of care: Med-Surg Family Communication: None present  Disposition Plan:  Patient is from: Home  Anticipated d/c is to: TBD Anticipated d/c date is: 06/04/24  Patient currently: Pending IR eval for tissue sampling, treatment of lumbar discitis with epidural phlegmon and paraspinal cellulitis  Consults called: Neurosurgery  Admission status: Inpatient     Evalene GORMAN Sprinkles, MD [facility]  05/31/2024, 12:35 AM

## 2024-05-31 NOTE — Procedures (Signed)
 Interventional Radiology Procedure Note  Procedure: Unsuccessful fluoroscopic guided L5-S1 disc aspiration  Findings: Please refer to procedural dictation for full description. Due to limitations in body habitus and fluoroscopy equipment, unable to achieve appropriate imaging window for L5-S1 disc access.  No aspiration performed.  Complications: None immediate  Estimated Blood Loss: < 5 ml  Recommendations: Recommend transfer to [facility] for additional attempt at disc aspiration with better fluoroscopic imaging equipment in IR.   Ester Sides, MD

## 2024-06-01 DIAGNOSIS — I10 Essential (primary) hypertension: Secondary | ICD-10-CM

## 2024-06-01 DIAGNOSIS — M4647 Discitis, unspecified, lumbosacral region: Secondary | ICD-10-CM | POA: Diagnosis not present

## 2024-06-01 DIAGNOSIS — M4627 Osteomyelitis of vertebra, lumbosacral region: Secondary | ICD-10-CM

## 2024-06-01 DIAGNOSIS — M4646 Discitis, unspecified, lumbar region: Secondary | ICD-10-CM | POA: Diagnosis not present

## 2024-06-01 LAB — BASIC METABOLIC PANEL WITH GFR
Anion gap: 12 (ref 5–15)
BUN: 10 mg/dL (ref 6–20)
CO2: 20 mmol/L — ABNORMAL LOW (ref 22–32)
Calcium: 9.6 mg/dL (ref 8.9–10.3)
Chloride: 106 mmol/L (ref 98–111)
Creatinine, Ser: 0.71 mg/dL (ref 0.44–1.00)
GFR, Estimated: >60 mL/min (ref >60)
Glucose, Bld: 102 mg/dL — ABNORMAL HIGH (ref 70–99)
Potassium: 3.6 mmol/L (ref 3.5–5.1)
Sodium: 137 mmol/L (ref 135–145)

## 2024-06-01 LAB — CBC
HCT: 33.7 % — ABNORMAL LOW (ref 36.0–46.0)
Hemoglobin: 9.6 g/dL — ABNORMAL LOW (ref 12.0–15.0)
MCH: 21.2 pg — ABNORMAL LOW (ref 26.0–34.0)
MCHC: 28.5 g/dL — ABNORMAL LOW (ref 30.0–36.0)
MCV: 74.6 fL — ABNORMAL LOW (ref 80.0–100.0)
Platelets: 383 K/uL (ref 150–400)
RBC: 4.52 MIL/uL (ref 3.87–5.11)
RDW: 17.9 % — ABNORMAL HIGH (ref 11.5–15.5)
WBC: 7 K/uL (ref 4.0–10.5)
nRBC: 0 % (ref 0.0–0.2)

## 2024-06-01 LAB — SEDIMENTATION RATE: Sed Rate: 57 mm/h — ABNORMAL HIGH (ref 0–22)

## 2024-06-01 LAB — CK: Total CK: 157 U/L (ref 38–234)

## 2024-06-01 LAB — C-REACTIVE PROTEIN: CRP: 5.1 mg/dL — ABNORMAL HIGH (ref <1.0)

## 2024-06-01 MED ORDER — HYDRALAZINE HCL 20 MG/ML IJ SOLN: 10.0000 mg | Freq: Four times a day (QID) | INTRAMUSCULAR | Status: DC | PRN

## 2024-06-01 MED ORDER — GABAPENTIN 300 MG PO CAPS
300.0000 mg | ORAL_CAPSULE | Freq: Two times a day (BID) | ORAL | Status: DC
  Administered 2024-06-01 – 2024-06-04 (×6): 300 mg via ORAL
  Filled 2024-06-01 (×6): qty 1

## 2024-06-01 NOTE — Progress Notes (Signed)
 Phis patient urinated 700 mL of red/dark pink urine with few clots. The patient said she has had an IUD in place since June and she does not bleed any more. Messaged Lynwood Kipper.

## 2024-06-01 NOTE — Progress Notes (Addendum)
 Voided urine output at 6:00 was 425 mL. Post-void bladder scan was 72 mL. Urine was pink with small clots and residual. Blood appears to be vaginal in origin. Patient stated she had an IUD placed in June and missed her follow-up appointment for IUD placement verification due to back pain. Messaged Lynwood Kipper.

## 2024-06-01 NOTE — Progress Notes (Signed)
 [facility]                                                                              Laura Mejia, is a 38 y.o. female, DOB - 1985/09/05, FMW:[id number] Admit date - 05/30/2024    Outpatient Primary MD for the patient is Patient, No Pcp Per  LOS - 2  days  Chief Complaint  Patient presents with   Back Pain       Brief summary   Patient is a 38 year old female with HTN, depression, anxiety, lumbar radiculopathy presented with low back pain, worsening since July 2025.  Patient had a recent epidural injection on 04/13/2024 (orthopedics, Dr. Eldonna) Outpatient MRI of the left hip was done on 10/5 which showed discitis/osteomyelitis at L5-S1 MRI of the lumbar spine 11/3 showed L5-S1 discitis/osteomyelitis, slight 3 mm thick ventral epidural enhancement suspicious for early epidural phlegmon.  Mild paraspinal edema/cellulitis identified S1. Patient reported fever chills over the past few weeks despite prior treatment with Augmentin  and Flagyl .  No trauma or other recent infections.  EDP spoke with the Lauraine Pickle, PA from neurosurgery and had not recommended neurosurgical intervention, recommended IR sampling and antibiotic treatment.  Assessment & Plan     Sepsis secondary to lumbar spine discitis/osteomyelitis, epidural phlegmon/abscess, paraspinal cellulitis, POA - Patient met sepsis criteria due to tachycardia, tachypnea with notable source of infection - IR was consulted, attempted L5-S1 disc aspiration, was unsuccessful - ID consulted, Dr Dea stopping antibiotics today for disc aspiration - On exam today, patient reported pain in the lower extremities, worse in right leg, with weakness and lower extremity paresthesias, no bowel or bladder incontinence. - Discussed with neurosurgery, Dr. Lanis, no acute neurosurgical intervention, needs antibiotics and repeat imaging in few weeks. Neurosurgery acutely only if cord compression. - Discussed with IR,  will transfer to [facility], needs IR aspiration from the lumbar spine.  IR will schedule the biopsy/aspiration once patient is at [facility]. - Continue pain control, oxycodone , gabapentin increased to twice daily   Hypertension -BP likely elevated due to pain, not on any antihypertensives outpatient - add IV hydralazine as needed with parameters  Anxiety/depression - Stable, not on any medications.  Obesity class I Estimated body mass index is 28.83 kg/m as calculated from the following:   Height as of this encounter: 5' 8 (1.727 m).   Weight as of this encounter: 86 kg.  Code Status: Full CODE STATUS DVT Prophylaxis:  SCDs Start: 05/30/24 2205   Level of Care: Level of care: Med-Surg Family Communication: Updated patient Disposition Plan:      Remains inpatient appropriate: Transfer to [facility] pending   Procedures:    Consultants:   IR ID Neurosurgery  Antimicrobials:   Anti-infectives (From admission, onward)    Start     Dose/Rate Route Frequency Ordered Stop   05/31/24 0800  vancomycin (VANCOREADY) IVPB 750 mg/150 mL  Status:  Discontinued        750 mg 150 mL/hr over 60 Minutes Intravenous Every 12 hours 05/30/24 2357 06/01/24 0908   05/31/24 0400  ceFEPIme (MAXIPIME) 2 g in sodium chloride  0.9 % 100 mL IVPB  Status:  Discontinued        2 g 200 mL/hr over 30 Minutes Intravenous Every 8 hours 05/30/24 2329 06/01/24 0908   05/30/24 2000  ceFEPIme (MAXIPIME) 2 g in sodium chloride  0.9 % 100 mL IVPB        2 g 200 mL/hr over 30 Minutes Intravenous  Once 05/30/24 1947 05/30/24 2041   05/30/24 2000  vancomycin (VANCOREADY) IVPB 2000 mg/400 mL        2,000 mg 200 mL/hr over 120 Minutes Intravenous  Once 05/30/24 1947 05/30/24 2222          Medications  gabapentin  300 mg Oral QHS      Subjective:   Laura Mejia was seen and examined today.  BP elevated due to pain.  Complaining of radicular pain both lower extremities, worse in RLE with paresthesias  and weakness.  Difficulty ambulating.  No fevers.  Objective:   Vitals:   05/31/24 1425 05/31/24 1500 05/31/24 2008 06/01/24 0442  BP: (!) 151/114 (!) 141/92 (!) 152/102 (!) 147/89  Pulse: (!) 110 95 (!) 106 93  Resp: (!) 8 15 15 18   Temp:  98 F (36.7 C) 98.3 F (36.8 C) 98.4 F (36.9 C)  TempSrc:  Oral Oral   SpO2: 99% 100%  97%  Weight:      Height:        Intake/Output Summary (Last 24 hours) at 06/01/2024 1030 Last data filed at 06/01/2024 0610 Gross per 24 hour  Intake 444.84 ml  Output 1997 ml  Net -1552.16 ml     Wt Readings from Last 3 Encounters:  05/31/24 86 kg  04/25/24 88 kg  02/17/24 87.1 kg     Exam General: Alert and oriented x 3, NAD Cardiovascular: S1 S2 auscultated,  RRR Respiratory: Clear to auscultation bilaterally, no wheezing Gastrointestinal: Soft, nontender, nondistended, + bowel sounds Ext: no pedal edema bilaterally Neuro: Pain and weakness bl LE Psych: Uncomfortable with pain    Data Reviewed:  I have personally reviewed following labs    CBC Lab Results  Component Value Date   WBC 7.0 06/01/2024   RBC 4.52 06/01/2024   HGB 9.6 (L) 06/01/2024   HCT 33.7 (L) 06/01/2024   MCV 74.6 (L) 06/01/2024   MCH 21.2 (L) 06/01/2024   PLT 383 06/01/2024   MCHC 28.5 (L) 06/01/2024   RDW 17.9 (H) 06/01/2024   LYMPHSABS 1.2 05/30/2024   MONOABS 0.5 05/30/2024   EOSABS 0.1 05/30/2024   BASOSABS 0.1 05/30/2024     Last metabolic panel Lab Results  Component Value Date   NA 137 06/01/2024   K 3.6 06/01/2024   CL 106 06/01/2024   CO2 20 (L) 06/01/2024   BUN 10 06/01/2024   CREATININE 0.71 06/01/2024   GLUCOSE 102 (H) 06/01/2024   GFRNONAA >60 06/01/2024   GFRAA >90 09/09/2011   CALCIUM 9.6 06/01/2024   PROT 7.3 05/30/2024   ALBUMIN 4.0 05/30/2024   LABGLOB 2.4 11/16/2023   BILITOT 0.4 05/30/2024   ALKPHOS 44 05/30/2024   AST 21 05/30/2024   ALT 20 05/30/2024   ANIONGAP 12 06/01/2024    CBG (last 3)  No results for  input(s): GLUCAP in the last 72 hours.    Coagulation Profile: Recent Labs  Lab 05/31/24 1047  INR 1.1     Radiology Studies: I have personally reviewed the imaging studies  IR LUMBAR DISC ASPIRATION W/IMG GUIDE Result Date: 05/31/2024 INDICATION: 38 year old female with history of L5-S1 discitis osteomyelitis. EXAM: L5-S1  DISC ASPIRATION UNDER FLUOROSCOPY MEDICATIONS: Lidocaine  1% subcutaneous ANESTHESIA/SEDATION: Moderate (conscious) sedation was employed during this procedure. A total of Versed 3 mg and Fentanyl  100 mcg was administered intravenously. Moderate Sedation Time: 17 minutes. The patient's level of consciousness and vital signs were monitored continuously by radiology nursing throughout the procedure under my direct supervision. PROCEDURE: Informed written consent was obtained from the patient after a thorough discussion of the procedural risks, benefits and alternatives. All questions were addressed. Maximal Sterile Barrier Technique was utilized including caps, mask, sterile gowns, sterile gloves, sterile drape, hand hygiene and skin antiseptic. A timeout was performed prior to the initiation of the procedure. The appropriate interspace was identified under fluoroscopy, corresponding to previous cross-sectional imaging. An appropriate skin entry site was determined. After local infiltration with 1% lidocaine , an 18 gauge trocar needle was attempted to be advanced into the interspace from left posterolateral extraforaminal approach. Unfortunately, the needle was unable to reach the interspace due to overlying osseous structures. Additional obliquity of the fluoroscope was not possible due to technical limitations and body habitus. The needle was removed and a sterile bandage was applied. The patient tolerated the procedure well. FLUOROSCOPY TIME:  One hundred forty-nine mGy reference air kerma COMPLICATIONS: None immediate. IMPRESSION: Technically unsuccessful L5-S1 disc aspiration  under fluoroscopy due to a combination of body habitus and fluoroscopic limitations. Ester Sides, MD [facility] Electronically Signed   By: Ester Sides M.D.   On: 05/31/2024 20:22   MR Lumbar Spine W Wo Contrast Result Date: 05/30/2024 EXAM: MRI Lumbar Spine 05/30/2024 06:17:38 PM TECHNIQUE: Multiplanar multisequence MRI of the lumbar spine was performed with and without the administration of 9mL gadobutrol  (GADAVIST ) 1 MMOL/ML IV. COMPARISON: MRI lumbar spine 02/17/2024 CLINICAL HISTORY: Lumbar radiculopathy, infection suspected, positive xray/CT FINDINGS: BONES AND ALIGNMENT: Normal alignment. Interval development of extensive edema within the L5-S1 disc with adjacent endplate erosive change and enhancement, compatible with discitis/osteomyelitis. SPINAL CORD: The conus terminates normally. SOFT TISSUES: Mild paraspinal edema at L5-S1.  No discrete abscess. L1-L2: No significant disc herniation. No spinal canal stenosis or neural foraminal narrowing. L2-L3: No significant disc herniation. No spinal canal stenosis or neural foraminal narrowing. L3-L4: No significant disc herniation. No spinal canal stenosis or neural foraminal narrowing. L4-L5: Small disc bulge and annular fissure. Similar mild subarticular recess stenosis. No significant canal or foraminal stenosis. L5-S1: Interval development of extensive edema within the L5-S1 disc with adjacent endplate erosive change and enhancement, compatible with discitis/osteomyelitis. Slight (3 mm thick) ventral epidural enhancement is suspicious for early epidural phlegmon. Small central disc protrusion. Canal is patent. IMPRESSION: 1. Findings compatible with L5-S1 discitis/osteomyelitis, detailed above. 2. Slight (3 mm thick) ventral epidural enhancement is suspicious for early epidural phlegmon. 3. Mild paraspinal edema/cellulitis at L5-S1. No discrete, drainable fluid collection. Electronically signed  by: Gilmore Molt MD 05/30/2024 07:36 PM EST RP Workstation: [id number]       Nydia Distance M.D. [facility] 06/01/2024, 10:30 AM  Available via Epic secure chat 7am-7pm After 7 pm, please refer to night coverage provider listed on amion.

## 2024-06-01 NOTE — Progress Notes (Signed)
       Overnight   NAME: JABREE PERNICE MRN: [id number] DOB : [birth year]    Date of Service   06/01/2024   HPI/Events of Note    Voided urine output at 6:00 was 425 mL. Urine was pink with small clots in residual.  Patient stated she had an IUD placed in June and missed her follow-up appointment for IUD placement verification due to back pain.    Interventions ordered    Interventions/ Plan         Lynwood Kipper BSN MSNA MSN ACNPC-AG Acute Care Nurse Practitioner [facility]

## 2024-06-01 NOTE — Progress Notes (Signed)
 In and out catheter yielded 600 mL of clear, yellow urine. The patient stated her abdomen did not feel pressure it has been having for several weeks and her leg and back had more mobility and much less pain post-I&O catheterization.

## 2024-06-01 NOTE — Progress Notes (Signed)
 Bladder scan volume is 336 mL.

## 2024-06-01 NOTE — Consult Note (Signed)
 [facility]                                                                                       Patient Identification: Patient Name: Laura Mejia MRN: [id number] Admit Date: 05/30/2024  3:24 PM Today's Date: 06/01/2024 Reason for consult: Lumbar discitis/osteomyelitis, phlegmon Requesting provider: Dr. Lue  Principal Problem:   Septic discitis of lumbar region Active Problems:   Essential (primary) hypertension   Antibiotics:  Vancomycin/cefepime 11/3-  Lines/Hardware:  Assessment # Acute on chronic back pain with L5-S1 discitis/osteomyelitis as well as 3 mm ventral epidural phlegmon in MRI - No intervention per neurosurgery - Unsuccessful fluoroscopic guided L5-S1 disc aspiration by IR on 11/4 - 11/3 blood cx 2/2 sets NGTD ( before antibiotics)  # 7/23 urine cx with 10,000 colonies per mL strep agalactiae  Recommendations - Patient is getting transferred to [facility] for repeat attempt for fluoroscopic guided aspiration of lumbar disc.  I will DC antibiotics to help with better yield of the cultures.  Patient is neither in sepsis or has any focal neurological deficits. Would recommend to be off antibiotics for at least 48 hrs prior to re-aspiration. Informed primary.  - ESR and CRP added on - Will notify MD ID team to follow-up once transferred - Universal/standard isolation precautions Following   Rest of the management as per the primary team. Please call with questions or concerns.  Thank you for the consult  __________________________________________________________________________________________________________ HPI and Hospital Course: 38 year old female with prior history of anxiety/depression, diverticulitis, HTN, lumbar radiculopathy, GERD who presented to the ED on 11/3 BIBEMS for severe back pain with inability to stand. She has been dealing with lower back pain  since July 19 per her report with radiation down to both legs, following with PCP and orthopedics with one time epidural injection in her back on 9/17.  She had an MRI done which showed concern for discitis at L5-S1.  No reported fever but had subjective chills and feeling of near syncopal episode.  Denies smoking, alcohol or any recreational drug use.  Has a dog at home.  No recent travel.  At ED afebrile, mildly tachycardic Labs remarkable for hemoglobin 10.5, platelets 454. Was given IVF, vancomycin, cefepime, oxycodone , morphine  and Zofran  Neurosurgery was consulted who recommended no surgical intervention and recommended antibiotics.  Patient did not have any neurological deficits on exam per EDP  MRI L spine  1. Findings compatible with L5-S1 discitis/osteomyelitis, detailed above. 2. Slight (3 mm thick) ventral epidural enhancement is suspicious for early epidural phlegmon. 3. Mild paraspinal edema/cellulitis at L5-S1. No discrete, drainable fluid collection.   ROS: General- Denies fever, chills, loss of appetite and loss of weight HEENT - Denies headache, blurry vision, neck pain, sinus pain Chest - Denies any chest pain, SOB or cough CVS- Denies any dizziness/lightheadedness, syncopal attacks, palpitations Abdomen- Denies any nausea, vomiting, abdominal pain, hematochezia and diarrhea Neuro - Denies any weakness, numbness, tingling sensation Psych - Denies any changes in mood irritability or depressive symptoms GU- Denies any burning, dysuria, hematuria or increased frequency of urination Skin - denies any rashes/lesions MSK - denies any joint pain/swelling  or restricted ROM   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)    Depression    Diverticulitis    Gallbladder problem    GERD (gastroesophageal reflux disease)    Heart murmur     Palpitations    Vitamin D  deficiency    Past Surgical History:  Procedure Laterality Date   CESAREAN SECTION     CHOLECYSTECTOMY     IR LUMBAR DISC ASPIRATION W/IMG GUIDE  05/31/2024   Scheduled Meds:  gabapentin  300 mg Oral QHS   Continuous Infusions:  ceFEPime (MAXIPIME) IV 2 g (06/01/24 0413)   vancomycin 750 mg (05/31/24 2050)   PRN Meds:.acetaminophen  **OR** acetaminophen , HYDROmorphone  (DILAUDID ) injection, methocarbamol  (ROBAXIN ) injection, oxyCODONE , prochlorperazine, senna-docusate  No Known Allergies  Social History   Socioeconomic History   Marital status: Married    Spouse name: Mabel   Number of children: 2   Years of education: Not on file   Highest education level: Not on file  Occupational History   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   Occupation: account review specialist - [employer]  Tobacco Use   Smoking status: Never   Smokeless tobacco: Never  Vaping Use   Vaping status: Never Used  Substance and Sexual Activity   Alcohol use: Not Currently    Comment: occ   Drug use: No   Sexual activity: Not on file  Other Topics Concern   Not on file  Social History Narrative   Not on file   Social Drivers of Health   Financial Resource Strain: Not on file  Food Insecurity: No Food Insecurity (05/31/2024)   Hunger Vital Sign    Worried About Running Out of Food in the Last Year: Never true    Ran Out of Food in the Last Year: Never true  Transportation Needs: No Transportation Needs (05/31/2024)   PRAPARE - Administrator, Civil Service (Medical): No    Lack of Transportation (Non-Medical): No  Physical Activity: Not on file  Stress: Not on file  Social Connections: Not on file  Intimate Partner Violence: Not At Risk (05/31/2024)   Humiliation, Afraid, Rape, and Kick questionnaire    Fear of Current or Ex-Partner: No    Emotionally Abused: No    Physically Abused: No    Sexually Abused: No   Family History   Problem Relation Age of Onset   Hypertension Mother    Stroke Mother    Depression Mother    Anxiety disorder Mother    Bipolar disorder Mother    Obesity Mother    Bone cancer Mother        10/2023 dx   Hypertension Father    Diabetes Father    Heart disease Father    Colon cancer Neg Hx    Rectal cancer Neg Hx    Stomach cancer Neg Hx    Esophageal cancer Neg Hx    Pancreatic cancer Neg Hx    Liver cancer Neg Hx     Vitals BP (!) 147/89 (BP Location: Right Arm)   Pulse 93   Temp 98.4 F (36.9 C)   Resp 18   Ht 5' 8 (1.727 m)   Wt 86 kg   SpO2 97%   BMI 28.83 kg/m   Physical Exam Constitutional: Adult  female lying in the bed, not in acute distress    Comments: HEENT WNL  Cardiovascular:     Rate and Rhythm: Normal rate and regular rhythm.     Heart sounds: S1 and S2  Pulmonary:     Effort: Pulmonary effort is normal.     Comments: Normal breath sounds  Abdominal:     Palpations: Abdomen is soft.     Tenderness: Nondistended and nontender  Musculoskeletal:        General: No swelling or tenderness in peripheral joints  Skin:    Comments: No rashes  Neurological:     General: Awake, alert and oriented, power 4 x 4 in all extremities.  No focal neurological deficits  Psychiatric:        Mood and Affect: Mood normal.    Pertinent Microbiology Results for orders placed or performed during the hospital encounter of 05/30/24  Culture, blood (routine x 2)     Status: None (Preliminary result)   Collection Time: 05/30/24  4:33 PM   Specimen: BLOOD  Result Value Ref Range Status   Specimen Description   Final    BLOOD LEFT ANTECUBITAL Performed at [facility], [address], [place], [place] [address]    Special Requests   Final    BOTTLES DRAWN AEROBIC AND ANAEROBIC Blood Culture adequate volume Performed at [facility], [address] [place], [place] [address]    Culture   Final    NO GROWTH 2  DAYS Performed at [facility], [address], [place], [place] [address]    Report Status PENDING  Incomplete  Culture, blood (routine x 2)     Status: None (Preliminary result)   Collection Time: 05/30/24  7:42 PM   Specimen: BLOOD  Result Value Ref Range Status   Specimen Description   Final    BLOOD LEFT ANTECUBITAL Performed at [facility], [address] [place], [place] [address]    Special Requests   Final    BOTTLES DRAWN AEROBIC AND ANAEROBIC Blood Culture results may not be optimal due to an inadequate volume of blood received in culture bottles Performed at [facility], [address], [place], [place] [address]    Culture   Final    NO GROWTH 1 DAY Performed at [facility], [address] [place], [place] [address]    Report Status PENDING  Incomplete   Pertinent Lab seen by me:    Latest Ref Rng & Units 06/01/2024    3:18 AM 05/31/2024    3:19 AM 05/30/2024    4:34 PM  CBC  WBC 4.0 - 10.5 K/uL 7.0  8.1  9.7   Hemoglobin 12.0 - 15.0 g/dL 9.6  9.7  89.4   Hematocrit 36.0 - 46.0 % 33.7  32.9  35.4   Platelets 150 - 400 K/uL 383  395  454       Latest Ref Rng & Units 06/01/2024    3:18 AM 05/31/2024    3:19 AM 05/30/2024    5:29 PM  CMP  Glucose 70 - 99 mg/dL 897  87  89   BUN 6 - 20 mg/dL 10  14  17    Creatinine 0.44 - 1.00 mg/dL 9.28  9.25  9.18   Sodium 135 - 145 mmol/L 137  139  139   Potassium 3.5 - 5.1 mmol/L 3.6  3.8  4.2   Chloride 98 - 111 mmol/L  106  109  108   CO2 22 - 32 mmol/L 20  21  23    Calcium 8.9 - 10.3 mg/dL 9.6  9.3  9.5   Total Protein 6.5 - 8.1 g/dL   7.3   Total Bilirubin 0.0 - 1.2 mg/dL   0.4   Alkaline Phos 38 - 126 U/L   44   AST 15 - 41 U/L   21   ALT 0 - 44 U/L   20      Pertinent Imagings/Other Imagings Plain films and CT images have been personally visualized and interpreted; radiology reports have been reviewed. Decision making incorporated into the Impression /  Recommendations.  IR LUMBAR DISC ASPIRATION W/IMG GUIDE Result Date: 05/31/2024 INDICATION: 38 year old female with history of L5-S1 discitis osteomyelitis. EXAM: L5-S1 DISC ASPIRATION UNDER FLUOROSCOPY MEDICATIONS: Lidocaine  1% subcutaneous ANESTHESIA/SEDATION: Moderate (conscious) sedation was employed during this procedure. A total of Versed 3 mg and Fentanyl  100 mcg was administered intravenously. Moderate Sedation Time: 17 minutes. The patient's level of consciousness and vital signs were monitored continuously by radiology nursing throughout the procedure under my direct supervision. PROCEDURE: Informed written consent was obtained from the patient after a thorough discussion of the procedural risks, benefits and alternatives. All questions were addressed. Maximal Sterile Barrier Technique was utilized including caps, mask, sterile gowns, sterile gloves, sterile drape, hand hygiene and skin antiseptic. A timeout was performed prior to the initiation of the procedure. The appropriate interspace was identified under fluoroscopy, corresponding to previous cross-sectional imaging. An appropriate skin entry site was determined. After local infiltration with 1% lidocaine , an 18 gauge trocar needle was attempted to be advanced into the interspace from left posterolateral extraforaminal approach. Unfortunately, the needle was unable to reach the interspace due to overlying osseous structures. Additional obliquity of the fluoroscope was not possible due to technical limitations and body habitus. The needle was removed and a sterile bandage was applied. The patient tolerated the procedure well. FLUOROSCOPY TIME:  One hundred forty-nine mGy reference air kerma COMPLICATIONS: None immediate. IMPRESSION: Technically unsuccessful L5-S1 disc aspiration under fluoroscopy due to a combination of body habitus and fluoroscopic limitations. Ester Sides, MD [facility]  Electronically Signed   By: Ester Sides M.D.   On: 05/31/2024 20:22   MR Lumbar Spine W Wo Contrast Result Date: 05/30/2024 EXAM: MRI Lumbar Spine 05/30/2024 06:17:38 PM TECHNIQUE: Multiplanar multisequence MRI of the lumbar spine was performed with and without the administration of 9mL gadobutrol  (GADAVIST ) 1 MMOL/ML IV. COMPARISON: MRI lumbar spine 02/17/2024 CLINICAL HISTORY: Lumbar radiculopathy, infection suspected, positive xray/CT FINDINGS: BONES AND ALIGNMENT: Normal alignment. Interval development of extensive edema within the L5-S1 disc with adjacent endplate erosive change and enhancement, compatible with discitis/osteomyelitis. SPINAL CORD: The conus terminates normally. SOFT TISSUES: Mild paraspinal edema at L5-S1.  No discrete abscess. L1-L2: No significant disc herniation. No spinal canal stenosis or neural foraminal narrowing. L2-L3: No significant disc herniation. No spinal canal stenosis or neural foraminal narrowing. L3-L4: No significant disc herniation. No spinal canal stenosis or neural foraminal narrowing. L4-L5: Small disc bulge and annular fissure. Similar mild subarticular recess stenosis. No significant canal or foraminal stenosis. L5-S1: Interval development of extensive edema within the L5-S1 disc with adjacent endplate erosive change and enhancement, compatible with discitis/osteomyelitis. Slight (3 mm thick) ventral epidural enhancement is suspicious for early epidural phlegmon. Small central disc protrusion. Canal is patent. IMPRESSION: 1. Findings compatible with L5-S1 discitis/osteomyelitis, detailed above. 2. Slight (3 mm thick) ventral epidural enhancement  is suspicious for early epidural phlegmon. 3. Mild paraspinal edema/cellulitis at L5-S1. No discrete, drainable fluid collection. Electronically signed by: Gilmore Molt MD 05/30/2024 07:36 PM EST RP Workstation: [id number]   MR Hip Left w/o contrast Result Date: 05/23/2024 CLINICAL DATA:  Hip pain, chronic, articular  cartilage eval, xray done Bilateral hip pain with weakness for 3 months, worse on the left. No acute injury or prior relevant surgery. EXAM: MR OF THE LEFT HIP WITHOUT CONTRAST TECHNIQUE: Multiplanar, multisequence MR imaging was performed. No intravenous contrast was administered. COMPARISON:  Radiographs 05/09/2024 and 03/11/2024. MRI lumbar spine 02/17/2024. CT pelvis 11/11/2022. FINDINGS: Bones: There is no evidence of acute fracture, dislocation or femoral head osteonecrosis. Mildly decreased T1 marrow signal throughout the bones, similar to previous MRI. No aggressive osseous lesions are identified. Coronal images demonstrate new T2 hyperintensity within the L5-S1 disc with surrounding marrow and soft tissue edema, highly suspicious for discitis/osteomyelitis. The visualized sacroiliac joints and symphysis pubis appear normal. Articular cartilage and labrum Articular cartilage: No focal chondral defect or subchondral signal abnormality identified. Labrum: There is no gross labral tear or paralabral abnormality. Joint or bursal effusion Joint effusion: No significant hip joint effusion. Bursae: No focal periarticular fluid collection. Muscles and tendons Muscles and tendons: The visualized gluteus, hamstring and iliopsoas tendons appear normal. No focal muscular atrophy or edema. The piriformis muscles appear symmetric. Other findings Miscellaneous: Small to moderate free pelvic fluid, within physiologic limits. There is also presacral edema attributed to the discitis and osteomyelitis at L5-S1. No focal fluid collections identified on coronal imaging. Intrauterine device noted. IMPRESSION: 1. Findings are highly suspicious for discitis/osteomyelitis at L5-S1. Recommend dedicated lumbar MRI without and with contrast for further evaluation. 2. No acute osseous findings or significant arthropathic changes in the hips. 3. The hip muscles and tendons appear unremarkable. 4. These results will be called to the  ordering clinician or representative by the Radiologist Assistant, and communication documented in the PACS or [employer]. Electronically Signed   By: Elsie Perone M.D.   On: 05/23/2024 09:14   XR HIP UNILAT W OR W/O PELVIS 2-3 VIEWS RIGHT Result Date: 05/09/2024 AP pelvis lateral view of the right hip: Bilateral hips well located.  Hip joints overall well-maintained.  No acute fractures acute findings.  No bony abnormalities or signs of AVN.   I spent 85 minutes involved in face-to-face and non-face-to-face activities for this patient on the day of the visit. Professional time spent includes the following activities: Preparing to see the patient (review of tests), Obtaining and reviewing separately obtained history (ED note, H&P, hospitalist progress note, IR note), Performing a medically appropriate examination and evaluation , Ordering medications/labs, referring and communicating with other health care professionals, Documenting clinical information in the EMR, Independently interpreting results (not separately reported), Communicating results to the patient, Counseling and educating the patient and Care coordination (not separately reported).  Electronically signed by:   Plan d/w requesting provider as well as ID pharm D  Of note, portions of this note may have been created with voice recognition software. While this note has been edited for accuracy, occasional wrong-word or 'sound-a-like' substitutions may have occurred due to the inherent limitations of voice recognition software.   Annalee Orem, MD Infectious Disease Physician [facility] Pager: [phone]

## 2024-06-01 NOTE — Plan of Care (Signed)
  Problem: Education: Goal: Knowledge of General Education information will improve Description: Including pain rating scale, medication(s)/side effects and non-pharmacologic comfort measures Outcome: Progressing   Problem: Activity: Goal: Risk for activity intolerance will decrease Outcome: Progressing   Problem: Elimination: Goal: Will not experience complications related to urinary retention Outcome: Progressing   Problem: Pain Managment: Goal: General experience of comfort will improve and/or be controlled Outcome: Progressing

## 2024-06-02 ENCOUNTER — Ambulatory Visit (INDEPENDENT_AMBULATORY_CARE_PROVIDER_SITE_OTHER): Payer: Self-pay | Admitting: Internal Medicine

## 2024-06-02 ENCOUNTER — Other Ambulatory Visit: Payer: Self-pay

## 2024-06-02 DIAGNOSIS — G061 Intraspinal abscess and granuloma: Secondary | ICD-10-CM | POA: Diagnosis not present

## 2024-06-02 DIAGNOSIS — M4646 Discitis, unspecified, lumbar region: Secondary | ICD-10-CM | POA: Diagnosis not present

## 2024-06-02 LAB — CBC
HCT: 31.4 % — ABNORMAL LOW (ref 36.0–46.0)
Hemoglobin: 9.6 g/dL — ABNORMAL LOW (ref 12.0–15.0)
MCH: 22.1 pg — ABNORMAL LOW (ref 26.0–34.0)
MCHC: 30.6 g/dL (ref 30.0–36.0)
MCV: 72.2 fL — ABNORMAL LOW (ref 80.0–100.0)
Platelets: 384 K/uL (ref 150–400)
RBC: 4.35 MIL/uL (ref 3.87–5.11)
RDW: 17.6 % — ABNORMAL HIGH (ref 11.5–15.5)
WBC: 7 K/uL (ref 4.0–10.5)
nRBC: 0 % (ref 0.0–0.2)

## 2024-06-02 LAB — BASIC METABOLIC PANEL WITH GFR
Anion gap: 10 (ref 5–15)
BUN: 9 mg/dL (ref 6–20)
CO2: 20 mmol/L — ABNORMAL LOW (ref 22–32)
Calcium: 9 mg/dL (ref 8.9–10.3)
Chloride: 108 mmol/L (ref 98–111)
Creatinine, Ser: 0.86 mg/dL (ref 0.44–1.00)
GFR, Estimated: >60 mL/min (ref >60)
Glucose, Bld: 90 mg/dL (ref 70–99)
Potassium: 3.5 mmol/L (ref 3.5–5.1)
Sodium: 138 mmol/L (ref 135–145)

## 2024-06-02 MED ORDER — HYDROXYZINE HCL 10 MG PO TABS
10.0000 mg | ORAL_TABLET | Freq: Three times a day (TID) | ORAL | Status: DC | PRN
Start: 2024-06-02 — End: 2024-06-04
  Administered 2024-06-02: 10 mg via ORAL
  Filled 2024-06-02: qty 1

## 2024-06-02 MED ORDER — KETOROLAC TROMETHAMINE 30 MG/ML IJ SOLN
30.0000 mg | Freq: Four times a day (QID) | INTRAMUSCULAR | Status: DC
  Administered 2024-06-02 – 2024-06-04 (×6): 30 mg via INTRAVENOUS
  Filled 2024-06-02 (×7): qty 1

## 2024-06-02 NOTE — Progress Notes (Addendum)
 [facility]   ROUNDING   NOTE LAMONT GLASSCOCK FMW:[id number]  DOB: 1985-10-29  DOA: 05/30/2024  PCP: Patient, No Pcp Per  06/02/2024,6:40 AM  LOS: 3 days    Code Status: Full code     from: Home   38 year old female Known history hypertension bipolar Lumbago since 02/13/2024 with bilateral lower extremity pain followed by Dr. 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 with mild to moderate bilateral subarticular narrowing of the L5 nerve roots  Chronology  9/17 ultimately underwent lumbar ESI L5-S1 10/13 follow-up visit orthopedics no relief steroid injection MRI left hip was scheduled- 11/3 because of ongoing severe pain subjective fevers and difficulty standing she was referred to the hospital-found to have sepsis on admission secondary to lumbar discitis osteomyelitis epidural phlegmon--- neurosurgery PA discussed the same 11/4 IR attempted sampling of area which was unsuccessful--- transferred to [facility] for reattempt at the same   Pertinent imaging/studies till date  11/3 MRI imaging confirms L5-S1 discitis osteomyelitis with early 3 mm phlegmon mild paraspinal edema cellulitis L5-S1  MRI left hip no osseous changes or significant Blood culture X2 NGTD-infectious disease recommended holding on antibiotics   Assessment  & Plan :    Sepsis on admission secondary to discitis osteomyelitis  ID recommends holding antimicrobials-further management etc. as per them IR to reattempt sample area under fluoroscopy today to obtain culture data to guide antimicrobial therapy Dr. Lanis of neurosurgery aware of patient and will discuss with him further planning if needed Pain control Tylenol  650 every 6 as needed, Oxy IR 5 every 4 as needed moderate, Dilaudid  1 every 3 as needed severe pain-adjunctive Neurontin 300 twice daily, Robaxin  500 IV every 6 as needed HTN Apparently has a history of this but is not on meds?  Is on  as needed hydralazine Transient urinary retention Probably secondary to back pain and urinary retention from spasm Seems to be resolved Bipolar Again-not on medications IUD in situ-some vaginal bleeding earlier-seemingly resolved Class I obesity BMI 28  Data Reviewed today:  Sodium 137 potassium 3.6 chloride 106 bicarb 28 BUN/creatinine 10/0.7-CRP 5.1 ESR 57 WBC 7.0 hemoglobin 9.6 platelets 383   DVT prophylaxis: SCD  Status is: Inpatient Inpatient pending resolution    Dispo/Global plan: Not ready for discharge   Time 60 minutes   Subjective:   Pain is controlled but she is immobile and needs to use a walker to get to the restroom She has no nausea vomiting fever chills at this time    Objective + exam Vitals:   06/01/24 1932 06/01/24 2217 06/01/24 2331 06/02/24 0436  BP: (!) 164/117 139/80 (!) 144/104 (!) 151/86  Pulse: (!) 113 94 (!) 105 95  Resp: 17  17 17   Temp: 98.5 F (36.9 C)  98.6 F (37 C) 98.4 F (36.9 C)  TempSrc: Oral   Oral  SpO2: 99%  100% 99%  Weight:      Height:       Filed Weights   05/30/24 2001 05/31/24 0010  Weight: 88 kg 86 kg     Examination: EOMI NCAT no focal deficit no icterus no pallor Mallampati 4 S1-S2 regular rate No wheeze Abdomen is obese nontender  she has tattoos over on the right side and left SLR diminished right side >left side-unable to raise leg >30 degrees R side Plantar dorsiflexion weaker on right side-temperature sensation equal bilaterally Reflexes are brisk/3+ bilaterally at knee-rest of exam deferred  Scheduled Meds:  gabapentin  300 mg Oral BID   Continuous Infusions: acetaminophen  **OR** acetaminophen , hydrALAZINE, HYDROmorphone  (DILAUDID ) injection, methocarbamol  (ROBAXIN ) injection, oxyCODONE , prochlorperazine, senna-docusate  Jai-Gurmukh Ramonte Mena, MD  [facility]

## 2024-06-02 NOTE — Consult Note (Signed)
 [facility]    Date of Admission:  05/30/2024     Total days of antibiotics   Hold -> received 2 days vanc + cefepime             Reason for Consult: Lumbar discitis/osteomyelitis and phlegmon    Referring Provider: [facility] Primary Care Provider: Patient, No Pcp Per   Assessment: Laura Mejia is a 38 y.o. female admitted with:    Lumbar discitis/osteomyelitis, phlegmon - Blood culutres collected 11/3 w/o abx pending but no growth. She had disc aspiratino attempt on 11/4 but tap was dry. She was referred to [facility] for second attempt at aspirate to see if we can fine an organism. Antibiotics were stopped in hopes to increase yield of organism.  Risk factor for infection includes recent epidural injection back in mid September - she had drenching sweats and some fevers noted a few weeks back. If no culture growth will treat with broad spectrum coverage; with possibly procedure related will need to consider keeping pseudomonas coverage.  - baseline CRP 5.1 ESR 57 - continue to hold abx until disc aspirate  - D/W Dr. Royal to see if we can add ketorolac  and/or longer acting analgesic to help with multimodal control for her.   Medication Monitoring -  Follow creatinine, CK - anticipate switch to daptomycin for safer home tx option. With procedural associated MRSA and PSA coverage should be considered.   Plan: - baseline CRP 5.1 ESR 57 - continue to hold abx until disc aspirate  - D/W Dr. Royal to see if we can add ketorolac  and/or longer acting analgesic to help with multimodal control for her.     Principal Problem:   Septic discitis of lumbar region Active Problems:   Essential (primary) hypertension    gabapentin  300 mg Oral BID    HPI: Laura Mejia is a 38 y.o. female admitted with back pain and concerns over infection   Initial back pain started July - sciatic nerve pain and bulge disc.  Early in the beginning she recalls  having chill and night sweats that did not make her think much about it as they were infrequent.  She feels recently it's more feeling hot - two episodes of drenching sweats a few weeks ago that resolved.. The current pain is severe and affects her ability to walk.   She has a steroid injection in September 17th at L5-S1 = that did not help at first - sees Dr. Eldonna at [facility]. 11/3 MRI imaging confirms L5-S1 discitis osteomyelitis with early 3 mm phlegmon mild paraspinal edema cellulitis L5-S1   She has been on leave from work since August - went back briefly after steroid injection and had to go back on leave. 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   Review of Systems: Review of Systems  Constitutional:  Positive for chills and diaphoresis.  Respiratory: Negative.    Cardiovascular: Negative.   Gastrointestinal:  Negative for abdominal pain, diarrhea, nausea and vomiting.  Genitourinary: Negative.   Musculoskeletal:  Positive for back pain.  Skin: Negative.   Neurological:  Positive for tingling and focal weakness.    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)    Depression  Diverticulitis    Gallbladder problem    GERD (gastroesophageal reflux disease)    Heart murmur    Palpitations    Vitamin D  deficiency     Social History   Tobacco Use   Smoking status: Never   Smokeless tobacco: Never  Vaping Use   Vaping status: Never Used  Substance Use Topics   Alcohol use: Not Currently    Comment: occ   Drug use: No    Family History  Problem Relation Age of Onset   Hypertension Mother    Stroke Mother    Depression Mother    Anxiety disorder Mother    Bipolar disorder Mother    Obesity Mother    Bone cancer Mother        10/2023 dx   Hypertension Father    Diabetes Father    Heart  disease Father    Colon cancer Neg Hx    Rectal cancer Neg Hx    Stomach cancer Neg Hx    Esophageal cancer Neg Hx    Pancreatic cancer Neg Hx    Liver cancer Neg Hx    No Known Allergies  OBJECTIVE: Blood pressure (!) 137/96, pulse 96, temperature 98.4 F (36.9 C), temperature source Oral, resp. rate 16, height 5' 8 (1.727 m), weight 86 kg, SpO2 99%.  Physical Exam  Lab Results Lab Results  Component Value Date   WBC 7.0 06/02/2024   HGB 9.6 (L) 06/02/2024   HCT 31.4 (L) 06/02/2024   MCV 72.2 (L) 06/02/2024   PLT 384 06/02/2024    Lab Results  Component Value Date   CREATININE 0.86 06/02/2024   BUN 9 06/02/2024   NA 138 06/02/2024   K 3.5 06/02/2024   CL 108 06/02/2024   CO2 20 (L) 06/02/2024    Lab Results  Component Value Date   ALT 20 05/30/2024   AST 21 05/30/2024   ALKPHOS 44 05/30/2024   BILITOT 0.4 05/30/2024     Microbiology: Recent Results (from the past 240 hours)  Culture, blood (routine x 2)     Status: None (Preliminary result)   Collection Time: 05/30/24  4:33 PM   Specimen: BLOOD  Result Value Ref Range Status   Specimen Description   Final    BLOOD LEFT ANTECUBITAL Performed at [facility], [address] [place], [place] [address]    Special Requests   Final    BOTTLES DRAWN AEROBIC AND ANAEROBIC Blood Culture adequate volume Performed at [facility], [address], [place], [place] [address]    Culture   Final    NO GROWTH 3 DAYS Performed at [facility], [address] [place], [place] [address]    Report Status PENDING  Incomplete  Culture, blood (routine x 2)     Status: None (Preliminary result)   Collection Time: 05/30/24  7:42 PM   Specimen: BLOOD  Result Value Ref Range Status   Specimen Description   Final    BLOOD LEFT ANTECUBITAL Performed at [facility], [address] [place], [place] [address]    Special Requests   Final    BOTTLES DRAWN AEROBIC  AND ANAEROBIC Blood Culture results may not be optimal due to an inadequate volume of blood received in culture bottles Performed at [facility], [address] [place], [place] [address]    Culture   Final    NO GROWTH 2 DAYS Performed at [facility],  [address], [place], [place] [address]    Report Status PENDING  Incomplete    Corean Fireman, MSN, NP-C [facility] Pager: [phone]  06/02/2024 10:10 AM    Total Encounter Time: 20 m

## 2024-06-03 ENCOUNTER — Encounter (HOSPITAL_COMMUNITY)
Admission: EM | Disposition: A | Discharge status: Home Health | Payer: Self-pay | Source: Home / Self Care | Attending: Family Medicine

## 2024-06-03 ENCOUNTER — Encounter

## 2024-06-03 ENCOUNTER — Inpatient Hospital Stay (HOSPITAL_COMMUNITY): Admitting: Certified Registered Nurse Anesthetist

## 2024-06-03 ENCOUNTER — Encounter (HOSPITAL_COMMUNITY): Payer: Self-pay | Admitting: Family Medicine

## 2024-06-03 ENCOUNTER — Inpatient Hospital Stay (HOSPITAL_COMMUNITY)

## 2024-06-03 DIAGNOSIS — M4647 Discitis, unspecified, lumbosacral region: Secondary | ICD-10-CM

## 2024-06-03 DIAGNOSIS — I10 Essential (primary) hypertension: Secondary | ICD-10-CM

## 2024-06-03 DIAGNOSIS — M4646 Discitis, unspecified, lumbar region: Secondary | ICD-10-CM | POA: Diagnosis not present

## 2024-06-03 DIAGNOSIS — D649 Anemia, unspecified: Secondary | ICD-10-CM

## 2024-06-03 DIAGNOSIS — E119 Type 2 diabetes mellitus without complications: Secondary | ICD-10-CM

## 2024-06-03 HISTORY — PX: RADIOLOGY WITH ANESTHESIA: SHX6223

## 2024-06-03 HISTORY — PX: IR LUMBAR DISC ASPIRATION W/IMG GUIDE: IMG5306

## 2024-06-03 LAB — CBC
HCT: 32.1 % — ABNORMAL LOW (ref 36.0–46.0)
Hemoglobin: 9.9 g/dL — ABNORMAL LOW (ref 12.0–15.0)
MCH: 21.9 pg — ABNORMAL LOW (ref 26.0–34.0)
MCHC: 30.8 g/dL (ref 30.0–36.0)
MCV: 70.9 fL — ABNORMAL LOW (ref 80.0–100.0)
Platelets: 384 K/uL (ref 150–400)
RBC: 4.53 MIL/uL (ref 3.87–5.11)
RDW: 17.7 % — ABNORMAL HIGH (ref 11.5–15.5)
WBC: 6.3 K/uL (ref 4.0–10.5)
nRBC: 0 % (ref 0.0–0.2)

## 2024-06-03 LAB — BASIC METABOLIC PANEL WITH GFR
Anion gap: 10 (ref 5–15)
BUN: 13 mg/dL (ref 6–20)
CO2: 21 mmol/L — ABNORMAL LOW (ref 22–32)
Calcium: 8.9 mg/dL (ref 8.9–10.3)
Chloride: 104 mmol/L (ref 98–111)
Creatinine, Ser: 0.69 mg/dL (ref 0.44–1.00)
GFR, Estimated: >60 mL/min (ref >60)
Glucose, Bld: 82 mg/dL (ref 70–99)
Potassium: 3.4 mmol/L — ABNORMAL LOW (ref 3.5–5.1)
Sodium: 135 mmol/L (ref 135–145)

## 2024-06-03 SURGERY — RADIOLOGY WITH ANESTHESIA
Anesthesia: General

## 2024-06-03 MED ORDER — LACTATED RINGERS IV SOLN: INTRAVENOUS | Status: DC

## 2024-06-03 MED ORDER — ORAL CARE MOUTH RINSE: 15.0000 mL | Freq: Once | OROMUCOSAL | Status: AC

## 2024-06-03 MED ORDER — SODIUM CHLORIDE 0.9% FLUSH: 10.0000 mL | INTRAVENOUS | Status: DC | PRN

## 2024-06-03 MED ORDER — LIDOCAINE HCL (PF) 1 % IJ SOLN
INTRAMUSCULAR | Status: AC
  Filled 2024-06-03: qty 30

## 2024-06-03 MED ORDER — FENTANYL CITRATE (PF) 250 MCG/5ML IJ SOLN
INTRAMUSCULAR | Status: AC
  Filled 2024-06-03: qty 5

## 2024-06-03 MED ORDER — SODIUM CHLORIDE 0.9 % IV SOLN
2.0000 g | Freq: Three times a day (TID) | INTRAVENOUS | Status: DC
  Administered 2024-06-03 – 2024-06-04 (×3): 2 g via INTRAVENOUS
  Filled 2024-06-03 (×4): qty 12.5

## 2024-06-03 MED ORDER — ONDANSETRON HCL 4 MG/2ML IJ SOLN
INTRAMUSCULAR | Status: DC | PRN
  Administered 2024-06-03: 4 mg via INTRAVENOUS

## 2024-06-03 MED ORDER — OXYCODONE HCL 5 MG PO TABS
5.0000 mg | ORAL_TABLET | ORAL | Status: DC | PRN
  Administered 2024-06-03: 5 mg via ORAL
  Filled 2024-06-03: qty 1

## 2024-06-03 MED ORDER — DEXAMETHASONE SOD PHOSPHATE PF 10 MG/ML IJ SOLN
INTRAMUSCULAR | Status: DC | PRN
  Administered 2024-06-03: 5 mg via INTRAVENOUS

## 2024-06-03 MED ORDER — ACETAMINOPHEN 650 MG RE SUPP: 650.0000 mg | Freq: Four times a day (QID) | RECTAL | Status: DC

## 2024-06-03 MED ORDER — CHLORHEXIDINE GLUCONATE 0.12 % MT SOLN
OROMUCOSAL | Status: AC
  Administered 2024-06-03: 15 mL via OROMUCOSAL
  Filled 2024-06-03: qty 15

## 2024-06-03 MED ORDER — PROPOFOL 10 MG/ML IV BOLUS
INTRAVENOUS | Status: DC | PRN
  Administered 2024-06-03: 200 mg via INTRAVENOUS

## 2024-06-03 MED ORDER — SUCCINYLCHOLINE CHLORIDE 200 MG/10ML IV SOSY
PREFILLED_SYRINGE | INTRAVENOUS | Status: DC | PRN
  Administered 2024-06-03: 140 mg via INTRAVENOUS

## 2024-06-03 MED ORDER — CHLORHEXIDINE GLUCONATE CLOTH 2 % EX PADS
6.0000 | MEDICATED_PAD | Freq: Every day | CUTANEOUS | Status: DC
  Administered 2024-06-03 – 2024-06-04 (×2): 6 via TOPICAL

## 2024-06-03 MED ORDER — FENTANYL CITRATE (PF) 250 MCG/5ML IJ SOLN
INTRAMUSCULAR | Status: DC | PRN
  Administered 2024-06-03 (×2): 50 ug via INTRAVENOUS
  Administered 2024-06-03: 100 ug via INTRAVENOUS
  Administered 2024-06-03: 50 ug via INTRAVENOUS

## 2024-06-03 MED ORDER — MIDAZOLAM HCL 2 MG/2ML IJ SOLN
INTRAMUSCULAR | Status: AC
Start: 2024-06-03 — End: 2024-06-03
  Filled 2024-06-03: qty 2

## 2024-06-03 MED ORDER — DAPTOMYCIN-SODIUM CHLORIDE 700-0.9 MG/100ML-% IV SOLN
8.0000 mg/kg | Freq: Every day | INTRAVENOUS | Status: DC
  Administered 2024-06-03 – 2024-06-04 (×2): 700 mg via INTRAVENOUS
  Filled 2024-06-03 (×4): qty 100

## 2024-06-03 MED ORDER — ALBUMIN HUMAN 5 % IV SOLN
12.5000 g | Freq: Once | INTRAVENOUS | Status: AC
  Administered 2024-06-03: 12.5 g via INTRAVENOUS

## 2024-06-03 MED ORDER — CHLORHEXIDINE GLUCONATE 0.12 % MT SOLN: 15.0000 mL | Freq: Once | OROMUCOSAL | Status: AC

## 2024-06-03 MED ORDER — ALBUMIN HUMAN 5 % IV SOLN
INTRAVENOUS | Status: AC
Start: 2024-06-03 — End: 2024-06-03
  Filled 2024-06-03: qty 250

## 2024-06-03 MED ORDER — ACETAMINOPHEN 500 MG PO TABS
1000.0000 mg | ORAL_TABLET | Freq: Four times a day (QID) | ORAL | Status: DC
  Administered 2024-06-03 – 2024-06-04 (×4): 1000 mg via ORAL
  Filled 2024-06-03 (×4): qty 2

## 2024-06-03 MED ORDER — LIDOCAINE 2% (20 MG/ML) 5 ML SYRINGE
INTRAMUSCULAR | Status: DC | PRN
  Administered 2024-06-03: 40 mg via INTRAVENOUS

## 2024-06-03 MED ORDER — MIDAZOLAM HCL (PF) 2 MG/2ML IJ SOLN
INTRAMUSCULAR | Status: DC | PRN
  Administered 2024-06-03 (×2): 1 mg via INTRAVENOUS

## 2024-06-03 NOTE — Progress Notes (Signed)
 Peripherally Inserted Central Catheter Placement  The IV Nurse has discussed with the patient and/or persons authorized to consent for the patient, the purpose of this procedure and the potential benefits and risks involved with this procedure.  The benefits include less needle sticks, lab draws from the catheter, and the patient may be discharged home with the catheter. Risks include, but not limited to, infection, bleeding, blood clot (thrombus formation), and puncture of an artery; nerve damage and irregular heartbeat and possibility to perform a PICC exchange if needed/ordered by physician.  Alternatives to this procedure were also discussed.  Bard Power PICC patient education guide, fact sheet on infection prevention and patient information card has been provided to patient /or left at bedside.    PICC Placement Documentation  PICC Single Lumen 06/03/24 Right Basilic 36 cm 0 cm (Active)  Indication for Insertion or Continuance of Line Prolonged intravenous therapies 06/03/24 0932  Exposed Catheter (cm) 0 cm 06/03/24 0932  Site Assessment Clean, Dry, Intact 06/03/24 0932  Line Status Flushed;Saline locked;Blood return noted 06/03/24 0932  Dressing Type Transparent;Securing device 06/03/24 0932  Dressing Status Antimicrobial disc/dressing in place;Clean, Dry, Intact 06/03/24 0932  Line Adjustment (NICU/IV Team Only) No 06/03/24 0932  Dressing Intervention New dressing;Adhesive placed at insertion site (IV team only) 06/03/24 0932  Dressing Change Due 06/10/24 06/03/24 0932       Jolee Na 06/03/2024, 9:35 AM

## 2024-06-03 NOTE — Plan of Care (Signed)
   Problem: Activity: Goal: Risk for activity intolerance will decrease Outcome: Progressing   Problem: Pain Managment: Goal: General experience of comfort will improve and/or be controlled Outcome: Progressing   Problem: Safety: Goal: Ability to remain free from injury will improve Outcome: Progressing

## 2024-06-03 NOTE — Anesthesia Preprocedure Evaluation (Addendum)
 Anesthesia Evaluation  Patient identified by MRN, date of birth, ID band Patient awake    Reviewed: Allergy & Precautions, NPO status , Patient's Chart, lab work & pertinent test results  Airway Mallampati: II  TM Distance: >3 FB Neck ROM: Full    Dental  (+) Dental Advisory Given   Pulmonary neg pulmonary ROS   breath sounds clear to auscultation       Cardiovascular hypertension,  Rhythm:Regular Rate:Normal     Neuro/Psych S/p epidural steroid injection now with discitis and early epidural phlegmon    GI/Hepatic Neg liver ROS,GERD  ,,  Endo/Other  diabetes, Type 2    Renal/GU negative Renal ROS     Musculoskeletal  (+) Arthritis ,    Abdominal   Peds  Hematology  (+) Blood dyscrasia, anemia   Anesthesia Other Findings   Reproductive/Obstetrics                              Anesthesia Physical Anesthesia Plan  ASA: 3  Anesthesia Plan: General   Post-op Pain Management: Tylenol  PO (pre-op)*   Induction: Intravenous  PONV Risk Score and Plan: 3 and Dexamethasone , Ondansetron  and Midazolam  Airway Management Planned: Oral ETT  Additional Equipment:   Intra-op Plan:   Post-operative Plan: Extubation in OR  Informed Consent: I have reviewed the patients History and Physical, chart, labs and discussed the procedure including the risks, benefits and alternatives for the proposed anesthesia with the patient or authorized representative who has indicated his/her understanding and acceptance.     Dental advisory given  Plan Discussed with: CRNA  Anesthesia Plan Comments:         Anesthesia Quick Evaluation

## 2024-06-03 NOTE — Progress Notes (Signed)
 I reviewed her chart and imaging. She has abnormalities at L5/S1 suspicious for discitis/osteomyelitis. Unsuccessful disc aspiration yesterday. Plan is for disc aspiration and bone biopsy of L5 if needed. I discussed the procedure and risks with her.

## 2024-06-03 NOTE — Anesthesia Postprocedure Evaluation (Signed)
 Anesthesia Post Note  Patient: Laura Mejia  Procedure(s) Performed: RADIOLOGY WITH ANESTHESIA     Patient location during evaluation: PACU Anesthesia Type: General Level of consciousness: patient cooperative, oriented and sedated Pain management: pain level controlled Vital Signs Assessment: post-procedure vital signs reviewed and stable Respiratory status: spontaneous breathing, nonlabored ventilation and respiratory function stable Cardiovascular status: blood pressure returned to baseline and stable Postop Assessment: no apparent nausea or vomiting Anesthetic complications: no   No notable events documented.  Last Vitals:  Vitals:   06/03/24 1515 06/03/24 1530  BP: 129/76 136/78  Pulse: (!) 103 96  Resp: 16 14  Temp:  37 C  SpO2: 95% 95%    Last Pain:  Vitals:   06/03/24 1530  TempSrc:   PainSc: 0-No pain                 Hakim Minniefield,E. Hamdi Vari

## 2024-06-03 NOTE — Progress Notes (Signed)
 PHARMACY CONSULT NOTE FOR:  OUTPATIENT  PARENTERAL ANTIBIOTIC THERAPY (OPAT)  Indication: Discitis  Regimen: Daptomycin 700 mg IV every 24 hours + Cefepime 2 gm IV Q 8 hours  End date: 07/15/24  Labs - Once weekly:  CBC/D, BMP, and CPK Labs - Once weekly: ESR and CRP Please pull PICC at the end of therapy     Thank you for allowing pharmacy to be a part of this patient's care.  Damien Quiet, PharmD, BCPS, BCIDP Infectious Diseases Clinical Pharmacist Phone: [phone] 06/03/2024, 3:18 PM

## 2024-06-03 NOTE — Transfer of Care (Signed)
 Immediate Anesthesia Transfer of Care Note  Patient: Laura Mejia  Procedure(s) Performed: RADIOLOGY WITH ANESTHESIA  Patient Location: PACU  Anesthesia Type:General  Level of Consciousness: drowsy and patient cooperative  Airway & Oxygen Therapy: Patient Spontanous Breathing and Patient connected to face mask oxygen  Post-op Assessment: Report given to RN and Post -op Vital signs reviewed and stable  Post vital signs: Reviewed and stable  Last Vitals:  Vitals Value Taken Time  BP 134/77 06/03/24 14:15  Temp 36.5 C 06/03/24 14:13  Pulse 114 06/03/24 14:25  Resp 18 06/03/24 14:25  SpO2 96 % 06/03/24 14:25  Vitals shown include unfiled device data.  Last Pain:  Vitals:   06/03/24 1415  TempSrc:   PainSc: 0-No pain      Patients Stated Pain Goal: 2 (06/01/24 0421)  Complications: No notable events documented.

## 2024-06-03 NOTE — Brief Op Note (Signed)
  NEUROSURGERY BRIEF OP NOTE   PREOP DX: L5/S1 discitis  POSTOP DX: Same  PROCEDURE: disc aspiration  SURGEON: Nancyann LULLA Burns   ANESTHESIA: GETA  EBL: None  COMPLICATIONS: No immediate  CONDITION: Stable  FINDINGS:  Sanguinous fluid  Nancyann LULLA Burns  @today @ 1:52 PM

## 2024-06-03 NOTE — Anesthesia Procedure Notes (Signed)
 Procedure Name: Intubation Date/Time: 06/03/2024 1:11 PM  Performed by: Leopoldo Wanda DASEN, CRNAPre-anesthesia Checklist: Patient identified, Emergency Drugs available, Suction available and Patient being monitored Patient Re-evaluated:Patient Re-evaluated prior to induction Oxygen Delivery Method: Circle System Utilized Preoxygenation: Pre-oxygenation with 100% oxygen Induction Type: IV induction Ventilation: Mask ventilation without difficulty Laryngoscope Size: Mac and 3 Grade View: Grade I Tube type: Oral Tube size: 7.0 mm Number of attempts: 1 Airway Equipment and Method: Stylet and Oral airway Placement Confirmation: ETT inserted through vocal cords under direct vision, positive ETCO2 and breath sounds checked- equal and bilateral Secured at: 22 cm Tube secured with: Tape Dental Injury: Teeth and Oropharynx as per pre-operative assessment

## 2024-06-03 NOTE — Progress Notes (Addendum)
 [facility]   ROUNDING   NOTE ZOII FLORER FMW:[id number]  DOB: [birth year]  DOA: 05/30/2024  PCP: Patient, No Pcp Per  06/03/2024,5:39 PM  LOS: 4 days    Code Status: Full code     from: Home   38 year old female Known history hypertension bipolar Lumbago since 02/13/2024 with bilateral lower extremity pain followed by Dr. 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 with mild to moderate bilateral subarticular narrowing of the L5 nerve roots  Chronology  9/17 ultimately underwent lumbar ESI L5-S1 10/13 follow-up visit orthopedics no relief steroid injection MRI left hip was scheduled- 11/3 because of ongoing severe pain subjective fevers and difficulty standing she was referred to the hospital-found to have sepsis on admission secondary to lumbar discitis osteomyelitis epidural phlegmon--- neurosurgery PA discussed the same 11/4 IR attempted sampling of area which was unsuccessful--- transferred to [facility] for reattempt at the same   Pertinent imaging/studies till date  11/3 MRI imaging confirms L5-S1 discitis osteomyelitis with early 3 mm phlegmon mild paraspinal edema cellulitis L5-S1  MRI left hip no osseous changes or significant Blood culture X2 NGTD-infectious disease recommended holding on antibiotics 11/6 PICC line placed 11/7 underwent L5-S1 disc aspiration which was serosanguineous   Assessment  & Plan :    Sepsis on admission secondary to discitis osteomyelitis  Greatly appreciate Dr. Lester I&D as above-await deep wound aerobic anaerobic culture--- would wait 24 to 48 hours for culture data but likely then discharge PICC line placed 11/6 started on 11/7 Cubicin as well as cefepime per pharmacy epic anticipate 6 weeks Pain meds adjusted to Tylenol  1000 every 6 Oxy IR 5-10 every 4 as needed first choice breakthrough Toradol  30 Q6 and then second choice Dilaudid  1 mg every 3 as needed Adjunctive Neurontin  300 twice daily, Robaxin  500 IV every 6 as needed-Will uptitrate dose as she needs for pain Therapy to see patient in a.m. for mobility HTN Apparently has a history of this but is not on meds?  Is on as needed hydralazine Transient urinary retention Probably secondary to back pain and urinary retention from spasm Seems to be resolved Check as needed Bipolar Again-not on medications Atarax started 10 mg 3 times daily as needed for IUD in situ-some vaginal bleeding earlier-seemingly resolved Class I obesity BMI 28  Data Reviewed today:   Sodium 135 potassium 3.4 CO2 21 BUN/creatinine 13/0.6 WBC 6.3 hemoglobin 9.9 platelet 384   DVT prophylaxis: SCD  Status is: Inpatient Inpatient pending resolution    Dispo/Global plan: Not ready for discharge   Time 60 minutes   Subjective:   Pain is controlled but she is immobile and needs to use a walker to get to the restroom She has no nausea vomiting fever chills at this time    Objective + exam Vitals:   06/03/24 1515 06/03/24 1530 06/03/24 1556 06/03/24 1558  BP: 129/76 136/78 (!) 144/92 (!) 141/91  Pulse: (!) 103 96 (!) 106   Resp: 16 14 16    Temp:  98.6 F (37 C) 98.4 F (36.9 C)   TempSrc:   Oral   SpO2: 95% 95% 96%   Weight:      Height:       Filed Weights   05/30/24 2001 05/31/24 0010  Weight: 88 kg 86 kg     Examination:  EOMI NCAT looks well from the standpoint Chest is clear S1, S2 slightly tacky Abdomen soft no rebound  Multiple tattoos Can raise right leg a little better than yesterday   Scheduled Meds:  acetaminophen   1,000 mg Oral Q6H   Or   acetaminophen   650 mg Rectal Q6H   Chlorhexidine Gluconate Cloth  6 each Topical Daily   gabapentin  300 mg Oral BID   ketorolac   30 mg Intravenous Q6H   Continuous Infusions:  ceFEPime (MAXIPIME) IV     DAPTOmycin     hydrALAZINE, HYDROmorphone  (DILAUDID ) injection, hydrOXYzine, methocarbamol  (ROBAXIN ) injection, oxyCODONE , prochlorperazine,  senna-docusate, sodium chloride  flush  Jai-Gurmukh Vinayak Bobier, MD  [facility]

## 2024-06-03 NOTE — Plan of Care (Signed)
 ?  Problem: Clinical Measurements: ?Goal: Ability to maintain clinical measurements within normal limits will improve ?Outcome: Progressing ?Goal: Will remain free from infection ?Outcome: Progressing ?Goal: Diagnostic test results will improve ?Outcome: Progressing ?  ?

## 2024-06-04 ENCOUNTER — Encounter: Payer: Self-pay | Admitting: Family Medicine

## 2024-06-04 ENCOUNTER — Other Ambulatory Visit (HOSPITAL_COMMUNITY): Payer: Self-pay

## 2024-06-04 DIAGNOSIS — M4646 Discitis, unspecified, lumbar region: Secondary | ICD-10-CM | POA: Diagnosis not present

## 2024-06-04 LAB — CBC
HCT: 30 % — ABNORMAL LOW (ref 36.0–46.0)
Hemoglobin: 9.3 g/dL — ABNORMAL LOW (ref 12.0–15.0)
MCH: 22.1 pg — ABNORMAL LOW (ref 26.0–34.0)
MCHC: 31 g/dL (ref 30.0–36.0)
MCV: 71.4 fL — ABNORMAL LOW (ref 80.0–100.0)
Platelets: 395 K/uL (ref 150–400)
RBC: 4.2 MIL/uL (ref 3.87–5.11)
RDW: 17.6 % — ABNORMAL HIGH (ref 11.5–15.5)
WBC: 6.6 K/uL (ref 4.0–10.5)
nRBC: 0 % (ref 0.0–0.2)

## 2024-06-04 LAB — CULTURE, BLOOD (ROUTINE X 2)
Culture: NO GROWTH
Special Requests: ADEQUATE

## 2024-06-04 LAB — BASIC METABOLIC PANEL WITH GFR
Anion gap: 12 (ref 5–15)
BUN: 21 mg/dL — ABNORMAL HIGH (ref 6–20)
CO2: 19 mmol/L — ABNORMAL LOW (ref 22–32)
Calcium: 9.1 mg/dL (ref 8.9–10.3)
Chloride: 106 mmol/L (ref 98–111)
Creatinine, Ser: 0.69 mg/dL (ref 0.44–1.00)
GFR, Estimated: >60 mL/min (ref >60)
Glucose, Bld: 175 mg/dL — ABNORMAL HIGH (ref 70–99)
Potassium: 3.6 mmol/L (ref 3.5–5.1)
Sodium: 137 mmol/L (ref 135–145)

## 2024-06-04 LAB — CK: Total CK: 132 U/L (ref 38–234)

## 2024-06-04 MED ORDER — DAPTOMYCIN IV (FOR PTA / DISCHARGE USE ONLY): 700.0000 mg | INTRAVENOUS | 0 refills | Status: DC

## 2024-06-04 MED ORDER — HEPARIN SOD (PORK) LOCK FLUSH 100 UNIT/ML IV SOLN
250.0000 [IU] | INTRAVENOUS | Status: AC | PRN
  Administered 2024-06-04: 250 [IU]

## 2024-06-04 MED ORDER — NAPROXEN 250 MG PO TABS
500.0000 mg | ORAL_TABLET | Freq: Two times a day (BID) | ORAL | Status: DC
  Administered 2024-06-04: 500 mg via ORAL
  Filled 2024-06-04 (×2): qty 2

## 2024-06-04 MED ORDER — HYDROXYZINE HCL 10 MG PO TABS
10.0000 mg | ORAL_TABLET | Freq: Three times a day (TID) | ORAL | 1 refills | Status: AC | PRN
  Filled 2024-06-04: qty 90, 30d supply, fill #0

## 2024-06-04 MED ORDER — NAPROXEN 500 MG PO TABS
500.0000 mg | ORAL_TABLET | Freq: Two times a day (BID) | ORAL | 1 refills | Status: DC
  Filled 2024-06-04: qty 60, 30d supply, fill #0

## 2024-06-04 MED ORDER — OXYCODONE HCL 5 MG PO TABS
5.0000 mg | ORAL_TABLET | ORAL | 0 refills | Status: DC | PRN
Start: 2024-06-04 — End: 2024-06-15
  Filled 2024-06-04: qty 30, 3d supply, fill #0

## 2024-06-04 MED ORDER — DICLOFENAC SODIUM 1 % EX GEL
2.0000 g | Freq: Four times a day (QID) | CUTANEOUS | 0 refills | Status: AC
  Filled 2024-06-04: qty 100, 14d supply, fill #0

## 2024-06-04 MED ORDER — CEFEPIME IV (FOR PTA / DISCHARGE USE ONLY): 2.0000 g | Freq: Three times a day (TID) | INTRAVENOUS | 0 refills | Status: DC

## 2024-06-04 MED ORDER — ACETAMINOPHEN 500 MG PO TABS
1000.0000 mg | ORAL_TABLET | Freq: Four times a day (QID) | ORAL | 0 refills | Status: DC
  Filled 2024-06-04: qty 30, 4d supply, fill #0

## 2024-06-04 NOTE — Progress Notes (Signed)
 Patient has received her discharge papers. Patient needs to receive IV antibiotics scheduled at 1400 before going home.

## 2024-06-04 NOTE — Progress Notes (Addendum)
 ID PROGRESS NOTE  Underwent IR aspiration of disc space yesterday. Thus far cultures and gram stain are negative  Has been started on daptomycin and cefepime  Home health is ready for education and delivery of abtx.  We are planning to have her complete 6 wk of iv abtx:    Indication: Discitis  Regimen: Daptomycin 700 mg IV every 24 hours + Cefepime 2 gm IV Q 8 hours  End date: 07/15/24  Labs - Once weekly:  CBC/D, BMP, and CPK Labs - Once weekly: ESR and CRP Please pull PICC at the end of therapy    Has follow up with me on dec 5th @ 9:15 am at [facility]. Okay to discharge from ID standpoint.  Montie FURY Luiz MD MPH [facility] [phone]

## 2024-06-04 NOTE — Plan of Care (Signed)
  Problem: Clinical Measurements: Goal: Ability to maintain clinical measurements within normal limits will improve Outcome: Progressing Goal: Cardiovascular complication will be avoided Outcome: Progressing   Problem: Nutrition: Goal: Adequate nutrition will be maintained Outcome: Progressing   

## 2024-06-04 NOTE — Evaluation (Signed)
 Physical Therapy Evaluation Patient Details Name: Laura Mejia MRN: [id number] DOB: May 20, 1986 Today's Date: 06/04/2024  History of Present Illness  Pt is a 38 y.o. female admitted 05/30/24 for sepsis secondary to lumbar discitis. MRI imaging confirms L5-S1 discitis osteomyelitis with early 3 mm phlegmon mild paraspinal edema cellulitis L5-S1. PICC line placed 11/6. Pt underwent L5-S1 disc aspiration 11/7. PMHx: lumbago, underwent lumbar ESI L5-S1 (04/13/24), HTN, T2DM, GERD, and arthritis.   Clinical Impression  Pt admitted with above diagnosis. PTA, pt was modI for functional mobility using a rollator, independent with ADLs/IADLs, and working. She lives with her son in a ground floor apartment with a level entry. Pt currently with functional limitations due to the deficits listed below (see PT Problem List). She performed bed mobility with supervision and required CGA-SBA for OOB mobility using RW. Pt is currently limited by back pain, muscle spasms, RLE weakness, decreased balance, and reduced activity tolerance. Introduced RW and educated pt on proper and safe use of AD. Pt demonstrated a multiple gait abnormalities with limited ability correct despite cueing. She utilized a step-to gait pattern with limited weight shift and stance time on RLE. Pt will benefit from acute skilled PT to increase their independence and safety with mobility to allow discharge. Recommend HHPT to increase strength, improve balance, decrease fall risk, and optimize safety within the home environment.       If plan is discharge home, recommend the following: A little help with walking and/or transfers;A little help with bathing/dressing/bathroom;Assistance with cooking/housework;Assist for transportation;Help with stairs or ramp for entrance   Can travel by private vehicle        Equipment Recommendations Other (comment) (Recommend RW, which pt declined.)  Recommendations for Other Services       Functional  Status Assessment Patient has had a recent decline in their functional status and demonstrates the ability to make significant improvements in function in a reasonable and predictable amount of time.     Precautions / Restrictions Precautions Precautions: Fall Recall of Precautions/Restrictions: Intact Precaution/Restrictions Comments: No formal back precautions, followed for safety/comfort. Restrictions Weight Bearing Restrictions Per Provider Order: No      Mobility  Bed Mobility Overal bed mobility: Needs Assistance Bed Mobility: Rolling, Sidelying to Sit Rolling: Supervision Sidelying to sit: Supervision       General bed mobility comments: Educated pt on log roll technique. She sat up from a flat bed without use of handrails. Increased time.    Transfers Overall transfer level: Needs assistance Equipment used: Rolling walker (2 wheels) Transfers: Sit to/from Stand, Bed to chair/wheelchair/BSC Sit to Stand: Contact guard assist, Supervision   Step pivot transfers: Contact guard assist, Supervision       General transfer comment: Pt stood from lowest bed height. Cued proper hand placement using RW. She powered up without physical assist. Close supervision-CGA for safety. Increased time to stand and achieve upright posture. Transferred to recliner chair. Pt reached back for arm rest. Good eccentric control.    Ambulation/Gait Ambulation/Gait assistance: Contact guard assist, Supervision Gait Distance (Feet): 200 Feet Assistive device: Rolling walker (2 wheels) Gait Pattern/deviations: Step-to pattern, Decreased step length - left, Decreased stance time - right, Decreased weight shift to right Gait velocity: decreased Gait velocity interpretation: <1.8 ft/sec, indicate of risk for recurrent falls   General Gait Details: Pt ambulated with short slow steps. She maintained upright posture with good proximity to RW. Pt self-limited weight acceptance on RLE. Decreased step  length and symmetry. Pt navigated room/hallway well,  no LOB.  Stairs            Wheelchair Mobility     Tilt Bed    Modified Rankin (Stroke Patients Only)       Balance Overall balance assessment: Needs assistance Sitting-balance support: No upper extremity supported, Feet supported Sitting balance-Leahy Scale: Fair Sitting balance - Comments: Pt sat EOB with supervision. She donned socks in long sitting.   Standing balance support: Bilateral upper extremity supported, During functional activity, Reliant on assistive device for balance Standing balance-Leahy Scale: Poor Standing balance comment: Pt dependent on RW                             Pertinent Vitals/Pain Pain Assessment Pain Assessment: 0-10 Pain Score: 7  Pain Location: Back Pain Descriptors / Indicators: Discomfort, Aching Pain Intervention(s): Monitored during session, Limited activity within patient's tolerance, Repositioned    Home Living Family/patient expects to be discharged to:: Private residence Living Arrangements: Children (32 y.o. son) Available Help at Discharge: Family;Available PRN/intermittently Type of Home: Apartment Home Access: Level entry       Home Layout: One level Home Equipment: Rollator (4 wheels);Cane - single point;Grab bars - tub/shower      Prior Function Prior Level of Function : Independent/Modified Independent;Working/employed             Mobility Comments: Ambulates using rollator vs. SPC. Has primarily been using rollator d/t weakness. Denies fall hx. ADLs Comments: Indep with ADLs/IADLs. Works for [employer]. Reports she hasn't been driving lately.     Extremity/Trunk Assessment   Upper Extremity Assessment Upper Extremity Assessment: Overall WFL for tasks assessed;Right hand dominant    Lower Extremity Assessment Lower Extremity Assessment: RLE deficits/detail RLE Deficits / Details: Decreased hip AROM and strength. Pt with tremors  within quad. She was able to slighlty lift leg during SLR attempt. 3-/5 hip strength. 3+/5 knee strength. 4/5 ankle strength. RLE Sensation: WNL RLE Coordination: decreased gross motor    Cervical / Trunk Assessment Cervical / Trunk Assessment: Back Surgery  Communication   Communication Communication: No apparent difficulties    Cognition Arousal: Alert Behavior During Therapy: WFL for tasks assessed/performed   PT - Cognitive impairments: No apparent impairments                       PT - Cognition Comments: Pt A,Ox4 Following commands: Intact       Cueing Cueing Techniques: Verbal cues     General Comments General comments (skin integrity, edema, etc.): VSS on RA    Exercises     Assessment/Plan    PT Assessment Patient needs continued PT services  PT Problem List Decreased strength;Decreased activity tolerance;Decreased balance;Decreased mobility       PT Treatment Interventions DME instruction;Gait training;Stair training;Functional mobility training;Balance training;Neuromuscular re-education    PT Goals (Current goals can be found in the Care Plan section)  Acute Rehab PT Goals Patient Stated Goal: Return Home today if possible PT Goal Formulation: With patient Time For Goal Achievement: 06/18/24 Potential to Achieve Goals: Good    Frequency Min 2X/week     Co-evaluation               AM-PAC PT 6 Clicks Mobility  Outcome Measure Help needed turning from your back to your side while in a flat bed without using bedrails?: A Little Help needed moving from lying on your back to sitting on the side  of a flat bed without using bedrails?: A Little Help needed moving to and from a bed to a chair (including a wheelchair)?: A Little Help needed standing up from a chair using your arms (e.g., wheelchair or bedside chair)?: A Little Help needed to walk in hospital room?: A Little Help needed climbing 3-5 steps with a railing? : A Lot 6 Click  Score: 17    End of Session Equipment Utilized During Treatment: Gait belt Activity Tolerance: Patient tolerated treatment well;No increased pain Patient left: in chair;with call bell/phone within reach;with chair alarm set Nurse Communication: Mobility status PT Visit Diagnosis: Muscle weakness (generalized) (M62.81);Difficulty in walking, not elsewhere classified (R26.2);Unsteadiness on feet (R26.81)    Time: 9073-9049 PT Time Calculation (min) (ACUTE ONLY): 24 min   Charges:   PT Evaluation $PT Eval Moderate Complexity: 1 Mod PT Treatments $Gait Training: 8-22 mins PT General Charges $$ ACUTE PT VISIT: 1 Visit         Randall SAUNDERS, PT, DPT Acute Rehabilitation Services Office: [phone] Secure Chat Preferred  Laura Mejia 06/04/2024, 10:02 AM

## 2024-06-04 NOTE — Plan of Care (Signed)

## 2024-06-04 NOTE — TOC Transition Note (Addendum)
 Transition of Care [facility]) - Discharge Note   Patient Details  Name: Laura Mejia MRN: [id number] Date of Birth: 04-Jul-1986  [facility] CM/SW Contact:  Robynn Eileen Hoose, RN Phone Number: 06/04/2024, 10:39 AM   Clinical Narrative:   Patient is being discharged today. Pam with [employer] made aware.  1055: Per Pam with [employer], IV antibiotics will be delivered to patient home between 4p-6p today. Patient will need her 2pm IV antibiotic doses before discharging home today. Floor nurse made aware.    Final next level of care: Home w Home Health Services Barriers to Discharge: No Barriers Identified   Patient Goals and CMS Choice            Discharge Placement                       Discharge Plan and Services Additional resources added to the After Visit Summary for                                       Social Drivers of Health (SDOH) Interventions SDOH Screenings   Food Insecurity: No Food Insecurity (05/31/2024)  Housing: Low Risk  (05/31/2024)  Transportation Needs: No Transportation Needs (05/31/2024)  Utilities: Not At Risk (05/31/2024)  Tobacco Use: Low Risk  (06/03/2024)     Readmission Risk Interventions     No data to display

## 2024-06-04 NOTE — Discharge Summary (Signed)
 Physician Discharge Summary  ESTALENE BERGEY FMW:[id number] DOB: 1986-05-14 DOA: 05/30/2024  PCP: Patient, No Pcp Per  Admit date: 05/30/2024 Discharge date: 06/04/2024  Time spent: 45 minutes  Recommendations for Outpatient Follow-up:  Complete DAPT, cefepime and/or adjust the same for 6 weeks ending 07/15/2024 and [facility] labs as per them as per protocol CC Gilmore Masters, MD [facility] to set up eventual appointment Home health ordered for patient Work note given and meds adjusted for pain control  Discharge Diagnoses:  MAIN problem for hospitalization   Discitis L5-S1  Please see below for itemized issues addressed in HOps[facility]- refer to other progress notes for clarity if needed  Discharge Condition: Improved  Diet recommendation: Heart healthy  Filed Weights   05/30/24 2001 05/31/24 0010  Weight: 88 kg 86 kg    History of present illness:  38-year-old female Known history hypertension bipolar Lumbago since 02/13/2024 with bilateral lower extremity pain followed by Dr. 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 with mild to moderate bilateral subarticular narrowing of the L5 nerve roots   Chronology  9/17 ultimately underwent lumbar ESI L5-S1 10/13 follow-up visit orthopedics no relief steroid injection MRI left hip was scheduled- 11/3 because of ongoing severe pain subjective fevers and difficulty standing she was referred to the hospital-found to have sepsis on admission secondary to lumbar discitis osteomyelitis epidural phlegmon--- neurosurgery PA discussed the same 11/4 IR attempted sampling of area which was unsuccessful--- transferred to [facility] for reattempt at the same    Pertinent imaging/studies till date  11/3 MRI imaging confirms L5-S1 discitis osteomyelitis with early 3 mm phlegmon mild paraspinal edema cellulitis L5-S1             MRI left hip no osseous  changes or significant Blood culture X2 NGTD-infectious disease recommended holding on antibiotics 11/6 PICC line placed 11/7 underwent L5-S1 disc aspiration which was serosanguineous    Assessment  & Plan :      Sepsis on admission secondary to discitis osteomyelitis  Greatly appreciate Dr. Lester I&D as above-discussed with scenario with Dr. Juli of ID who feels comfortable de-escalating as an outpatient if anything were to grow so far initial cultures from aspirate are negative with nothing growing on Gram stain PICC line placed 11/6 started on 11/7 Cubicin as well as cefepime per pharmacy epic anticipate 6 weeks ending on 12/19 Infectious disease follow-up scheduled for December 5 at 9:30 AM patient aware Pain meds prescribed for patient and escalating doses and explained this carefully to patient Therapy recommends home health and rollator and she ambulated 300 feet I have CCed Dr. Gilmore Masters to make them aware of her admission and they can set up a follow-up as Laci necessary-she may just do well on her own with slow careful therapy at home HTN Apparently has a history of this but is not on meds?  Is on as needed hydralazine Outpatient reeval is fine Transient urinary retention Probably secondary to back pain and urinary retention from spasm Seems to be resolved Check as needed Bipolar Again-not on medications Atarax started 10 mg 3 times daily she got a good benefit from this so she should probably continue this going forward IUD in situ-some vaginal bleeding earlier-seemingly resolved Class I obesity BMI 28 Outpatient reeval  Discharge Exam: Vitals:   06/04/24 0421 06/04/24 0700  BP: 137/82 (!) 138/92  Pulse: 85 77  Resp: 18 20  Temp: (!)  97.2 F (36.2 C) 97.7 F (36.5 C)  SpO2: 97% 98%    Subj on day of d/c   Awake coherent pleasant No distress eating drinking Walking with antalgic gait in the hall but walked close to 300 feet  General Exam on  discharge  EOMI NCAT no focal deficit S1-S2 no murmur Abdomen soft Straight leg raise is improved on the right side although still weak reflexes are 2/3 Plantar dorsiflexion improved on right side Sensory intact Multiple tattoos  Discharge Instructions   Discharge Instructions     [facility] pharmacist to adjust dose for Vancomycin, Aminoglycosides and other anti-infective therapies as requested by physician.   Complete by: As directed    [facility] to provide Cath Flo 2mg    Complete by: As directed    Administer for PICC line occlusion and as ordered by physician for other access device issues.   Anaphylaxis Kit: Provided to treat any anaphylactic reaction to the medication being provided to the patient if First Dose or when requested by physician   Complete by: As directed    Epinephrine 1mg /ml vial / amp: Administer 0.3mg  (0.51ml) subcutaneously once for moderate to severe anaphylaxis, nurse to call physician and pharmacy when reaction occurs and call 911 if needed for immediate care   Diphenhydramine 50mg /ml IV vial: Administer 25-50mg  IV/IM PRN for first dose reaction, rash, itching, mild reaction, nurse to call physician and pharmacy when reaction occurs   Sodium Chloride  0.9% NS 500ml IV: Administer if needed for hypovolemic blood pressure drop or as ordered by physician after call to physician with anaphylactic reaction   Change dressing on IV access line weekly and PRN   Complete by: As directed    Diet - low sodium heart healthy   Complete by: As directed    Discharge instructions   Complete by: As directed    Make sure that you use Tylenol  around-the-clock Naprosyn with food as a first choice is for pain Continue your Robaxin  as well as your Flexeril  that you have I have ordered Voltaren gel for the mid back pain Lastly if you have pain above 6 on 10 please use the opiates as this will help you in the short-term  It would be a good idea to take  some over-the-counter MiraLAX or senna if you are constipated We will write your work note excusing you from work you have an appointment with Dr. Juli on December 5 as per the paperwork  Please use your walker at all times Please ensure that if you have high-grade fever chills nausea vomiting or severe pain that you seek attention immediately  There will be some teaching with regards to your IV administration of these antibiotics which is relatively easy but does need some attention It would be a good idea also for you to visit with orthopedics at [facility] and I will CC your doctors there to make sure that they are aware of your admission I am   best of luck and hopefully this is the last that you need to be seen in acute care but follow-up with your primary   Flush IV access with Sodium Chloride  0.9% and Heparin 10 units/ml or 100 units/ml   Complete by: As directed    Home infusion instructions - [facility]   Complete by: As directed    Instructions: Flush IV access with Sodium Chloride  0.9% and Heparin 10units/ml or 100units/ml   Change dressing on IV access line: Weekly and PRN  Instructions Cath Flo 2mg : Administer for PICC Line occlusion and as ordered by physician for other access device   [facility] pharmacist to adjust dose for: Vancomycin, Aminoglycosides and other anti-infective therapies as requested by physician   Increase activity slowly   Complete by: As directed    Method of administration may be changed at the discretion of home infusion pharmacist based upon assessment of the patient and/or caregiver's ability to self-administer the medication ordered   Complete by: As directed       Allergies as of 06/04/2024   No Known Allergies      Medication List     TAKE these medications    acetaminophen  500 MG tablet Commonly known as: TYLENOL  Take 2 tablets (1,000 mg total) by mouth every 6 (six) hours.   ceFEPime IVPB Commonly known as:  MAXIPIME Inject 2 g into the vein every 8 (eight) hours. Indication:  Discitis  First Dose: Yes Last Day of Therapy:  07/15/24  Labs - Once weekly:  CBC/D and BMP, Labs - Once weekly: ESR and CRP Method of administration: IV Push Method of administration may be changed at the discretion of home infusion pharmacist based upon assessment of the patient and/or caregiver's ability to self-administer the medication ordered.   cyclobenzaprine  10 MG tablet Commonly known as: FLEXERIL  Take 1 tablet (10 mg total) by mouth 3 (three) times daily as needed for muscle spasms.   daptomycin IVPB Commonly known as: CUBICIN Inject 700 mg into the vein daily. Indication:  Discitis  First Dose: Yes Last Day of Therapy:  07/15/24  Labs - Once weekly:  CBC/D, BMP, and CPK Labs - Once weekly: ESR and CRP Method of administration: IV Push Method of administration may be changed at the discretion of home infusion pharmacist based upon assessment of the patient and/or caregiver's ability to self-administer the medication ordered.   diclofenac Sodium 1 % Gel Commonly known as: Voltaren Arthritis Pain Apply 2 g topically 4 (four) times daily.   gabapentin 300 MG capsule Commonly known as: NEURONTIN Take 1 capsule (300 mg total) by mouth at bedtime.   hydrOXYzine 10 MG tablet Commonly known as: ATARAX Take 1 tablet (10 mg total) by mouth 3 (three) times daily as needed for anxiety.   naproxen 500 MG tablet Commonly known as: NAPROSYN Take 1 tablet (500 mg total) by mouth 2 (two) times daily with a meal.   oxyCODONE  5 MG immediate release tablet Commonly known as: Oxy IR/ROXICODONE  Take 1-2 tablets (5-10 mg total) by mouth every 4 (four) hours as needed for moderate pain (pain score 4-6).               Discharge Care Instructions  (From admission, onward)           Start     Ordered   06/04/24 0000  Change dressing on IV access line weekly and PRN  (Home infusion instructions - [facility] )        06/04/24 1000           No Known Allergies    The results of significant diagnostics from this hospitalization (including imaging, microbiology, ancillary and laboratory) are listed below for reference.    Significant Diagnostic Studies: US  EKG SITE RITE Result Date: 06/02/2024 If Site Rite image not attached, placement could not be confirmed due to current cardiac rhythm.  IR LUMBAR DISC ASPIRATION W/IMG GUIDE Result Date: 05/31/2024 INDICATION: 38 year old female with history of L5-S1 discitis osteomyelitis. EXAM:  L5-S1 DISC ASPIRATION UNDER FLUOROSCOPY MEDICATIONS: Lidocaine  1% subcutaneous ANESTHESIA/SEDATION: Moderate (conscious) sedation was employed during this procedure. A total of Versed 3 mg and Fentanyl  100 mcg was administered intravenously. Moderate Sedation Time: 17 minutes. The patient's level of consciousness and vital signs were monitored continuously by radiology nursing throughout the procedure under my direct supervision. PROCEDURE: Informed written consent was obtained from the patient after a thorough discussion of the procedural risks, benefits and alternatives. All questions were addressed. Maximal Sterile Barrier Technique was utilized including caps, mask, sterile gowns, sterile gloves, sterile drape, hand hygiene and skin antiseptic. A timeout was performed prior to the initiation of the procedure. The appropriate interspace was identified under fluoroscopy, corresponding to previous cross-sectional imaging. An appropriate skin entry site was determined. After local infiltration with 1% lidocaine , an 18 gauge trocar needle was attempted to be advanced into the interspace from left posterolateral extraforaminal approach. Unfortunately, the needle was unable to reach the interspace due to overlying osseous structures. Additional obliquity of the fluoroscope was not possible due to technical limitations and body habitus. The needle was removed and a  sterile bandage was applied. The patient tolerated the procedure well. FLUOROSCOPY TIME:  One hundred forty-nine mGy reference air kerma COMPLICATIONS: None immediate. IMPRESSION: Technically unsuccessful L5-S1 disc aspiration under fluoroscopy due to a combination of body habitus and fluoroscopic limitations. Ester Sides, MD [facility] Electronically Signed   By: Ester Sides M.D.   On: 05/31/2024 20:22   MR Lumbar Spine W Wo Contrast Result Date: 05/30/2024 EXAM: MRI Lumbar Spine 05/30/2024 06:17:38 PM TECHNIQUE: Multiplanar multisequence MRI of the lumbar spine was performed with and without the administration of 9mL gadobutrol  (GADAVIST ) 1 MMOL/ML IV. COMPARISON: MRI lumbar spine 02/17/2024 CLINICAL HISTORY: Lumbar radiculopathy, infection suspected, positive xray/CT FINDINGS: BONES AND ALIGNMENT: Normal alignment. Interval development of extensive edema within the L5-S1 disc with adjacent endplate erosive change and enhancement, compatible with discitis/osteomyelitis. SPINAL CORD: The conus terminates normally. SOFT TISSUES: Mild paraspinal edema at L5-S1.  No discrete abscess. L1-L2: No significant disc herniation. No spinal canal stenosis or neural foraminal narrowing. L2-L3: No significant disc herniation. No spinal canal stenosis or neural foraminal narrowing. L3-L4: No significant disc herniation. No spinal canal stenosis or neural foraminal narrowing. L4-L5: Small disc bulge and annular fissure. Similar mild subarticular recess stenosis. No significant canal or foraminal stenosis. L5-S1: Interval development of extensive edema within the L5-S1 disc with adjacent endplate erosive change and enhancement, compatible with discitis/osteomyelitis. Slight (3 mm thick) ventral epidural enhancement is suspicious for early epidural phlegmon. Small central disc protrusion. Canal is patent. IMPRESSION: 1. Findings compatible with L5-S1  discitis/osteomyelitis, detailed above. 2. Slight (3 mm thick) ventral epidural enhancement is suspicious for early epidural phlegmon. 3. Mild paraspinal edema/cellulitis at L5-S1. No discrete, drainable fluid collection. Electronically signed by: Gilmore Molt MD 05/30/2024 07:36 PM EST RP Workstation: [id number]   MR Hip Left w/o contrast Result Date: 05/23/2024 CLINICAL DATA:  Hip pain, chronic, articular cartilage eval, xray done Bilateral hip pain with weakness for 3 months, worse on the left. No acute injury or prior relevant surgery. EXAM: MR OF THE LEFT HIP WITHOUT CONTRAST TECHNIQUE: Multiplanar, multisequence MR imaging was performed. No intravenous contrast was administered. COMPARISON:  Radiographs 05/09/2024 and 03/11/2024. MRI lumbar spine 02/17/2024. CT pelvis 11/11/2022. FINDINGS: Bones: There is no evidence of acute fracture, dislocation or femoral head osteonecrosis. Mildly decreased T1 marrow signal throughout the bones, similar to previous MRI. No aggressive osseous lesions are  identified. Coronal images demonstrate new T2 hyperintensity within the L5-S1 disc with surrounding marrow and soft tissue edema, highly suspicious for discitis/osteomyelitis. The visualized sacroiliac joints and symphysis pubis appear normal. Articular cartilage and labrum Articular cartilage: No focal chondral defect or subchondral signal abnormality identified. Labrum: There is no gross labral tear or paralabral abnormality. Joint or bursal effusion Joint effusion: No significant hip joint effusion. Bursae: No focal periarticular fluid collection. Muscles and tendons Muscles and tendons: The visualized gluteus, hamstring and iliopsoas tendons appear normal. No focal muscular atrophy or edema. The piriformis muscles appear symmetric. Other findings Miscellaneous: Small to moderate free pelvic fluid, within physiologic limits. There is also presacral edema attributed to the discitis and osteomyelitis at L5-S1. No  focal fluid collections identified on coronal imaging. Intrauterine device noted. IMPRESSION: 1. Findings are highly suspicious for discitis/osteomyelitis at L5-S1. Recommend dedicated lumbar MRI without and with contrast for further evaluation. 2. No acute osseous findings or significant arthropathic changes in the hips. 3. The hip muscles and tendons appear unremarkable. 4. These results will be called to the ordering clinician or representative by the Radiologist Assistant, and communication documented in the PACS or [employer]. Electronically Signed   By: Elsie Perone M.D.   On: 05/23/2024 09:14   XR HIP UNILAT W OR W/O PELVIS 2-3 VIEWS RIGHT Result Date: 05/09/2024 AP pelvis lateral view of the right hip: Bilateral hips well located.  Hip joints overall well-maintained.  No acute fractures acute findings.  No bony abnormalities or signs of AVN.   Microbiology: Recent Results (from the past 240 hours)  Culture, blood (routine x 2)     Status: None   Collection Time: 05/30/24  4:33 PM   Specimen: BLOOD  Result Value Ref Range Status   Specimen Description   Final    BLOOD LEFT ANTECUBITAL Performed at [facility], [address] [place], [place] [address]    Special Requests   Final    BOTTLES DRAWN AEROBIC AND ANAEROBIC Blood Culture adequate volume Performed at [facility], [address] [place], [place] [address]    Culture   Final    NO GROWTH 5 DAYS Performed at [facility], [address], [place], [place] [address]    Report Status 06/04/2024 FINAL  Final  Culture, blood (routine x 2)     Status: None (Preliminary result)   Collection Time: 05/30/24  7:42 PM   Specimen: BLOOD  Result Value Ref Range Status   Specimen Description   Final    BLOOD LEFT ANTECUBITAL Performed at [facility], [address] [place], [place] [address]    Special Requests   Final    BOTTLES DRAWN AEROBIC AND  ANAEROBIC Blood Culture results may not be optimal due to an inadequate volume of blood received in culture bottles Performed at [facility], [address], [place], [place] [address]    Culture   Final    NO GROWTH 4 DAYS Performed at [facility], [address], [place], [place] [address]    Report Status PENDING  Incomplete  Aerobic/Anaerobic Culture w Gram Stain (surgical/deep wound)     Status: None (Preliminary result)   Collection Time: 06/03/24  3:05 PM   Specimen: Wound  Result Value Ref Range Status   Specimen Description WOUND  Final   Special Requests INTERVERTEBRAL DISC ASPIRATION  Final   Gram Stain   Final    ABUNDANT WBC  PRESENT, PREDOMINANTLY PMN NO ORGANISMS SEEN Gram Stain Report Called to,Read Back By and Verified With: RN FABIENE GONE [id number] @ 2113 FH Performed at [facility], [address] [place], [place] [address]    Culture PENDING  Incomplete   Report Status PENDING  Incomplete     Labs: Basic Metabolic Panel: Recent Labs  Lab 05/31/24 0319 06/01/24 0318 06/02/24 0350 06/03/24 0403 06/04/24 0230  NA 139 137 138 135 137  K 3.8 3.6 3.5 3.4* 3.6  CL 109 106 108 104 106  CO2 21* 20* 20* 21* 19*  GLUCOSE 87 102* 90 82 175*  BUN 14 10 9 13  21*  CREATININE 0.74 0.71 0.86 0.69 0.69  CALCIUM 9.3 9.6 9.0 8.9 9.1   Liver Function Tests: Recent Labs  Lab 05/30/24 1729  AST 21  ALT 20  ALKPHOS 44  BILITOT 0.4  PROT 7.3  ALBUMIN 4.0   No results for input(s): LIPASE, AMYLASE in the last 168 hours. No results for input(s): AMMONIA in the last 168 hours. CBC: Recent Labs  Lab 05/30/24 1634 05/31/24 0319 06/01/24 0318 06/02/24 0350 06/03/24 0403 06/04/24 0230  WBC 9.7 8.1 7.0 7.0 6.3 6.6  NEUTROABS 7.9*  --   --   --   --   --   HGB 10.5* 9.7* 9.6* 9.6* 9.9* 9.3*  HCT 35.4* 32.9* 33.7* 31.4* 32.1* 30.0*  MCV 73.4* 73.8* 74.6* 72.2* 70.9* 71.4*  PLT 454* 395 383 384 384 395   Cardiac  Enzymes: Recent Labs  Lab 06/01/24 1117 06/04/24 0230  CKTOTAL 157 132   BNP: BNP (last 3 results) No results for input(s): BNP in the last 8760 hours.  ProBNP (last 3 results) No results for input(s): PROBNP in the last 8760 hours.  CBG: No results for input(s): GLUCAP in the last 168 hours.  Signed:  Jai-Gurmukh Aziel Morgan MD   [facility] 06/04/2024, 10:00 AM

## 2024-06-05 LAB — CULTURE, BLOOD (ROUTINE X 2): Culture: NO GROWTH

## 2024-06-06 ENCOUNTER — Telehealth: Payer: Self-pay

## 2024-06-06 ENCOUNTER — Encounter (HOSPITAL_COMMUNITY): Payer: Self-pay | Admitting: Radiology

## 2024-06-06 ENCOUNTER — Encounter

## 2024-06-06 ENCOUNTER — Ambulatory Visit: Admitting: Internal Medicine

## 2024-06-06 NOTE — Telephone Encounter (Signed)
 Hey just an FYI:Per patient she is going to be seen by Laura Mejia she advised she didn't need to be seen by Megan.---Please look at the referral for instructions.

## 2024-06-06 NOTE — Telephone Encounter (Signed)
 I have forward message to [place] about patient. Tory has previously discussed patient with Dr. Georgina and wanted to patient to follow up with Dr. Georgina after new MRI

## 2024-06-07 ENCOUNTER — Encounter: Payer: Self-pay | Admitting: Internal Medicine

## 2024-06-07 NOTE — Telephone Encounter (Signed)
 Secure chat sent to Dr.Snider - she will call patient.   Laura Mejia SHAUNNA Letters, CMA

## 2024-06-08 LAB — AEROBIC/ANAEROBIC CULTURE W GRAM STAIN (SURGICAL/DEEP WOUND): Culture: NO GROWTH

## 2024-06-09 ENCOUNTER — Other Ambulatory Visit: Payer: Self-pay

## 2024-06-09 ENCOUNTER — Ambulatory Visit: Admitting: Orthopedic Surgery

## 2024-06-09 VITALS — BP 128/94 | HR 98 | Ht 68.0 in | Wt 190.0 lb

## 2024-06-09 DIAGNOSIS — M545 Low back pain, unspecified: Secondary | ICD-10-CM | POA: Diagnosis not present

## 2024-06-09 NOTE — Progress Notes (Signed)
 Orthopedic Spine Surgery Office Note  Assessment: Patient is a 38 y.o. female with low back pain and right hip pain. No pain radiating past the hip. Has L5/S1 osteomyelitis/discitis   Plan: -Patient said that she was not very ambulatory prior to this infection. She said she was having difficulty with mobilizing due to pain even back in July when that MRI was obtained so I do not think that this discitis is causing her decrease in mobility since that has not changed. 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 Would recommend continue non-operative treatment for that. For her discitis, she should continue with antibiotic therapy. I will see her after she completes that course on 07/18/2024. She should remain out of work until that time. If infectious disease, does not get inflammatory markers, I will at our next appointment -Patient can be out of bed as tolerated without any brace -Mobilize as able. Will start PT as pain gets better -Patient should return to the office in mid December. X-rays at next visit: AP/lateral lumbar   Patient expressed understanding of the plan and all questions were answered to the patient's satisfaction.   ___________________________________________________________________________   History:  Patient is a 38 y.o. female who presents today for lumbar spine. Patient has a history of low back and bilateral leg pain that started in July of this year. She had tried multiple conservative treatments without relief. She has been out of work since onset. She has had difficulty walking as a result of the pain. She then noticed worsening of her pain and was worked up by [employer] in the office. Patient was noted to have discitis. She eventually got admitted to the hospital where antibiotic treatment was initiated for a L5/S1 discitis. Cultures from the disc were attempted while in the hospital. She was eventually discharged from the hospital.  She is on daptomycin and cefepime. She has a PICC. She has an end date of 07/15/2024 for those antibiotics. She is following with infectious disease. She still has significant low back. She is now reporting pain in the low back and the right buttock and groin. No pain radiating past the hip/buttock. No pain in the left lower extremity. No bowel or bladder incontinence. No saddle anesthesia. Still having significant pain with mobilization. Is using a rollator. Said she has difficulty standing long enough to shower due to the pain.   Treatments tried: PT, celebrex , tizanidine , gabapentin, oral steroids, lumbar steroid injection  Review of systems: Denies fevers, unexplained weight loss, history of cancer. Has had night sweats and chills. Reports pain that has wakened her at night  Past medical history: HTN Anxiety GERD  Allergies: NKDA  Past surgical history:  Cholecystectomy C section  Social history: Denies use of nicotine product (smoking, vaping, patches, smokeless) Alcohol use: denies Denies recreational drug use   Physical Exam:  BMI of 28.9  General: no acute distress, appears stated age Neurologic: alert, answering questions appropriately, following commands Respiratory: unlabored breathing on room air, symmetric chest rise Psychiatric: appropriate affect, normal cadence to speech   MSK (spine):  -Strength exam      Left  Right EHL    5/5  5/5 TA    5/5  5/5 GSC    5/5  5/5 Knee extension  5/5  5/5 Hip flexion   5/5  5/5  -Sensory exam    Sensation intact to light touch in L3-S1 nerve distributions of bilateral lower extremities  Imaging: XRs of  the lumbar spine from 06/09/2024 were independently reviewed and interpreted, showing disc height loss and irregular endplates at L5/S1. No significant disc height loss at other levels. No fracture or dislocation seen. No spondylolisthesis seen.   MRI of the lumbar spine from 05/30/2024 was independently reviewed and  interpreted, showing increased T2 signal with the intervertebral disc and endplates at L5/S1. No epidural abscess seen. No significant central, lateral recess, or foraminal stenosis seen.    Patient name: Laura Mejia Patient MRN: [id number] Date of visit: 06/09/24

## 2024-06-10 ENCOUNTER — Encounter

## 2024-06-12 ENCOUNTER — Encounter: Payer: Self-pay | Admitting: Orthopedic Surgery

## 2024-06-13 ENCOUNTER — Ambulatory Visit: Admitting: Orthopaedic Surgery

## 2024-06-15 ENCOUNTER — Telehealth: Payer: Self-pay | Admitting: Family Medicine

## 2024-06-15 ENCOUNTER — Telehealth: Payer: Self-pay | Admitting: Orthopedic Surgery

## 2024-06-15 ENCOUNTER — Telehealth (INDEPENDENT_AMBULATORY_CARE_PROVIDER_SITE_OTHER): Admitting: Internal Medicine

## 2024-06-15 VITALS — Ht 61.0 in | Wt 187.0 lb

## 2024-06-15 DIAGNOSIS — E66812 Obesity, class 2: Secondary | ICD-10-CM

## 2024-06-15 DIAGNOSIS — R29818 Other symptoms and signs involving the nervous system: Secondary | ICD-10-CM

## 2024-06-15 DIAGNOSIS — M4646 Discitis, unspecified, lumbar region: Secondary | ICD-10-CM

## 2024-06-15 DIAGNOSIS — E88819 Insulin resistance, unspecified: Secondary | ICD-10-CM | POA: Diagnosis not present

## 2024-06-15 DIAGNOSIS — E6609 Other obesity due to excess calories: Secondary | ICD-10-CM

## 2024-06-15 DIAGNOSIS — M545 Low back pain, unspecified: Secondary | ICD-10-CM

## 2024-06-15 DIAGNOSIS — Z6837 Body mass index (BMI) 37.0-37.9, adult: Secondary | ICD-10-CM

## 2024-06-15 MED ORDER — SEMAGLUTIDE-WEIGHT MANAGEMENT 0.25 MG/0.5ML ~~LOC~~ SOAJ: 0.2500 mg | SUBCUTANEOUS | 0 refills | Status: AC

## 2024-06-15 MED ORDER — ACETAMINOPHEN 500 MG PO TABS: 1000.0000 mg | ORAL_TABLET | Freq: Three times a day (TID) | ORAL | 0 refills | Status: AC

## 2024-06-15 MED ORDER — OXYCODONE HCL 5 MG PO TABS: 5.0000 mg | ORAL_TABLET | ORAL | 0 refills | Status: AC | PRN

## 2024-06-15 MED ORDER — CYCLOBENZAPRINE HCL 10 MG PO TABS: 10.0000 mg | ORAL_TABLET | Freq: Three times a day (TID) | ORAL | 0 refills | Status: DC | PRN

## 2024-06-15 MED ORDER — NAPROXEN 500 MG PO TABS: 500.0000 mg | ORAL_TABLET | Freq: Two times a day (BID) | ORAL | 0 refills | Status: AC

## 2024-06-15 NOTE — Assessment & Plan Note (Signed)
 Following injection therapy for lower back problems.  She is currently on cefepime IV and has restrictions in mobility.  We discussed reviewing restrictions with managing team as she is at risk for muscular deconditioning and is starting to notice weakness.

## 2024-06-15 NOTE — Assessment & Plan Note (Signed)
 Sleep study has not been scheduled due to circumstances.

## 2024-06-15 NOTE — Telephone Encounter (Signed)
 Noted

## 2024-06-15 NOTE — Telephone Encounter (Signed)
 Pt's family member dropped off forms and pains 20.00 fee

## 2024-06-15 NOTE — Progress Notes (Signed)
 Virtual Visit via Video Note  I connected withNAME@ on 06/15/24 at  2:40 PM EST by a video enabled telemedicine application and verified that I am speaking with the correct person using two identifiers.  Location:  Patient: Home Provider: Office   I discussed the limitations of evaluation and management by telemedicine and the availability of in person appointments. The patient expressed understanding and agreed to proceed.    Weight Summary And Biometrics  No data recorded Anthropometric Measurements Height: 5' 1 (1.549 m) Weight: 187 lb (84.8 kg) (reported by pt via TH - do not use for next OV) BMI (Calculated): 35.35 Weight at Last Visit: 194 lb Weight Lost Since Last Visit: 7 lbs Weight Gained Since Last Visit: 0 lbs Starting Weight: 203 lb Total Weight Loss (lbs): 16 lb (7.258 kg) Peak Weight: 208 lb   No data recorded  RMR: 1728  Today's Visit #: 6  Starting Date: 11/16/23   Subjective   Chief Complaint: Obesity  Discussed the use of AI scribe software for clinical note transcription with the patient, who gave verbal consent to proceed.  History of Present Illness Laura Mejia is a 38 year old female with obesity who presents for a virtual visit for medical weight management.  She is following a 1200-calorie nutrition plan with fair adherence.  She has not been exercising due to problems with her spine.  She has lost seven pounds since her last visit in September, partially due to a recent hospitalization and ongoing health issues. She has been using Wegovy for a month, which has helped reduce her hunger cravings. Currently, she is unable to prepare meals and relies on others to bring or order food for her.  She was hospitalized for six days after receiving a back injection. She is currently administering IV antibiotics three times a day and is expected to continue this treatment until December 19th. She experiences significant back pain and has been  out of work since October 1st, with her leave extended until at least December 22nd due to her inability to walk.  She experiences neuropathy, with pain in her calf muscles, the top of her foot, and shooting pain down her legs when she stands. This is a new symptom that started recently. She is not currently undergoing physical therapy.  She has a history of blood pressure issues and insulin  resistance. She is currently taking pain medications and gabapentin. She has not yet completed a sleep study.     Patient Reported Barriers to Progress: Impaired mobility due to exercise restriction from septic discitis.   Pharmacotherapy for weight management: She is currently taking Wegovy with adequate clinical response  and without side effects..  Recent treatment interruption due to hospitalization  Assessment and Plan   Treatment Plan For Obesity:  Recommended Dietary Goals  Annikah is currently in the action stage of change. As such, her goal is to continue weight management plan. She has agreed to: follow the Category 2 plan - 1200 kcal per day, incorporate prepackaged healthy meals for convenience, and incorporate 1-2 meal replacements a day for convenience   Behavioral Health and Counseling  We discussed the following behavioral modification strategies today: increasing lean protein intake to established goals, avoiding skipping meals, and work on meal planning and preparation.  Additional education and resources provided today: None  Recommended Physical Activity Goals  Kimberl has been advised to work up to 150 minutes of moderate intensity aerobic activity a week and strengthening exercises 2-3 times per  week for cardiovascular health, weight loss maintenance and preservation of muscle mass.   She has agreed to :  Discussed with managing team about physical activity restrictions current restrictions appear to be overly restrictive  Pharmacotherapy  We discussed various medication  options to help Raejean with her weight loss efforts and we both agreed to : Resume treatment with Wegovy at 0.25 mg once a week for the management of obesity and associated conditions which include hypertension, insulin  resistance she is also suspected to have sleep apnea but has not been able to obtain sleep consultation  Associated Conditions Impacted by Obesity Treatment  Assessment & Plan Class 2 obesity due to excess calories without serious comorbidity with body mass index (BMI) of 37.0 to 37.9 in adult 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 Recent weight loss of 7 pounds since September, likely due to hospitalization and decreased physical activity. Previously on Wegovy 0.25 mg with good tolerance and reduced hunger cravings. Current inability to prepare meals due to back issues and hospitalization. Emphasis on maintaining a 1200 calorie diet due to decreased physical activity. Discussed potential for weight gain due to reduced mobility and importance of maintaining nutritional intake. - Prescribed Wegovy 0.25 mg and [facility] - Scheduled virtual follow-up visit in 4 weeks to assess progress and consider dose adjustment. - Advised on maintaining a 1200 calorie diet using prepackaged meals, protein shakes, and frozen meals. - Encouraged exploration of meal options such as Long Life Meal Prep and [place] protein soups. - Advised on the importance of mobility to prevent muscle weakness and potential blood clots. Suspected sleep apnea Sleep study has not been scheduled due to circumstances. Septic discitis of lumbar region Following injection therapy for lower back problems.  She is currently on cefepime IV and has restrictions in mobility.  We discussed reviewing restrictions with managing team as she is at risk for muscular deconditioning and is starting to notice weakness.          Objective   Physical Exam:  Height 5' 1 (1.549 m),  weight 187 lb (84.8 kg), last menstrual period 05/30/2024. Body mass index is 35.33 kg/m.  General: She is overweight, cooperative, alert, well developed, and in no acute distress. PSYCH: Has normal mood, affect and thought process.   Lungs: Normal breathing effort, no conversational dyspnea.    Diagnostic Data Reviewed:  BMET    Component Value Date/Time   NA 137 06/04/2024 0230   NA 139 11/16/2023 0950   K 3.6 06/04/2024 0230   CL 106 06/04/2024 0230   CO2 19 (L) 06/04/2024 0230   GLUCOSE 175 (H) 06/04/2024 0230   BUN 21 (H) 06/04/2024 0230   BUN 10 11/16/2023 0950   CREATININE 0.69 06/04/2024 0230   CALCIUM 9.1 06/04/2024 0230   GFRNONAA >60 06/04/2024 0230   GFRAA >90 09/09/2011 1545   Lab Results  Component Value Date   HGBA1C 5.5 11/16/2023   Lab Results  Component Value Date   INSULIN  21.4 11/16/2023   Lab Results  Component Value Date   TSH 2.570 05/30/2024   CBC    Component Value Date/Time   WBC 6.6 06/04/2024 0230   RBC 4.20 06/04/2024 0230   HGB 9.3 (L) 06/04/2024 0230   HGB 10.0 (L) 11/16/2023 0950   HCT 30.0 (L) 06/04/2024 0230   HCT 34.7 11/16/2023 0950   PLT 395 06/04/2024 0230   PLT 494 (H) 11/16/2023 0950   MCV 71.4 (L) 06/04/2024 0230  MCV 72 (L) 11/16/2023 0950   MCH 22.1 (L) 06/04/2024 0230   MCHC 31.0 06/04/2024 0230   RDW 17.6 (H) 06/04/2024 0230   RDW 17.5 (H) 11/16/2023 0950   Iron Studies    Component Value Date/Time   IRON 24 (L) 11/19/2022 1222   TIBC 368.2 11/19/2022 1222   FERRITIN 23.3 11/19/2022 1222   IRONPCTSAT 6.5 (L) 11/19/2022 1222   Lipid Panel     Component Value Date/Time   CHOL 178 11/16/2023 0950   TRIG 118 11/16/2023 0950   HDL 59 11/16/2023 0950   LDLCALC 98 11/16/2023 0950   Hepatic Function Panel     Component Value Date/Time   PROT 7.3 05/30/2024 1729   PROT 6.7 11/16/2023 0950   ALBUMIN 4.0 05/30/2024 1729   ALBUMIN 4.3 11/16/2023 0950   AST 21 05/30/2024 1729   ALT 20 05/30/2024 1729    ALKPHOS 44 05/30/2024 1729   BILITOT 0.4 05/30/2024 1729   BILITOT <0.2 11/16/2023 0950      Component Value Date/Time   TSH 2.570 05/30/2024 1635   TSH 4.330 11/16/2023 0950   Nutritional Lab Results  Component Value Date   VD25OH 6.1 (L) 11/16/2023    Follow-Up   No follow-ups on file.SABRA She was informed of the importance of frequent follow up visits to maximize her success with intensive lifestyle modifications for her multiple health conditions.  Attestation Statement   Reviewed by clinician on day of visit: allergies, medications, problem list, medical history, surgical history, family history, social history, and previous encounter notes.   I discussed the assessment and treatment plan with the patient. The patient was provided an opportunity to ask questions and all were answered. The patient agreed with the plan and demonstrated an understanding of the instructions.   The patient was advised to call back or seek an in-person evaluation if the symptoms worsen or if the condition fails to improve as anticipated.  I have spent 24 minutes in the care of the patient today including: 2 minutes before the visit reviewing and preparing the chart. 18 minutes face-to-face assessing and reviewing listed medical problems as outlined in obesity care plan, providing nutritional and behavioral counseling on topics outlined in the obesity care plan, and ordering medications - see orders 4 minutes after the visit updating chart and documentation of encounter.    Lucas Parker, MD

## 2024-06-15 NOTE — Assessment & Plan Note (Signed)
 Obesity class 2 with insulin  resistance. Recent weight loss of 7 pounds since September, likely due to hospitalization and decreased physical activity. Previously on Wegovy  0.25 mg with good tolerance and reduced hunger cravings. Current inability to prepare meals due to back issues and hospitalization. Emphasis on maintaining a 1200 calorie diet due to decreased physical activity. Discussed potential for weight gain due to reduced mobility and importance of maintaining nutritional intake. - Prescribed Wegovy  0.25 mg and sent prescription to [facility]. - Scheduled virtual follow-up visit in 4 weeks to assess progress and consider dose adjustment. - Advised on maintaining a 1200 calorie diet using prepackaged meals, protein shakes, and frozen meals. - Encouraged exploration of meal options such as Long Life Meal Prep and [place] protein soups. - Advised on the importance of mobility to prevent muscle weakness and potential blood clots.

## 2024-06-15 NOTE — Telephone Encounter (Signed)
 Pt called saying that she has left several My Chart messages over the last 3 days concerning her medications from the hospital. Medications are Oxycodone  5 mg, Naproxen 550 mg, Acetaminophen  500 mg tablets, and Cyclobenzaprine  10 mg. [facility] [address]. She also says that she is having excruciating leg pain  Patient call back number is [phone].

## 2024-06-17 ENCOUNTER — Telehealth: Payer: Self-pay

## 2024-06-17 MED ORDER — GABAPENTIN 300 MG PO CAPS: 300.0000 mg | ORAL_CAPSULE | Freq: Three times a day (TID) | ORAL | 0 refills | Status: DC

## 2024-06-17 NOTE — Telephone Encounter (Signed)
 Patient and wanted to know if she could get an increase on her gabapentin .  CB# [id number].  Please advise.  Thank you.

## 2024-06-17 NOTE — Addendum Note (Signed)
 Addended by: GEORGINA SHARPER on: 06/17/2024 02:24 PM   Modules accepted: Orders

## 2024-06-25 ENCOUNTER — Telehealth: Payer: Self-pay | Admitting: Infectious Diseases

## 2024-06-25 ENCOUNTER — Emergency Department (HOSPITAL_COMMUNITY)
Admission: EM | Admit: 2024-06-25 | Discharge: 2024-06-25 | Disposition: A | Discharge status: Home / Self Care | Attending: Emergency Medicine | Admitting: Emergency Medicine

## 2024-06-25 DIAGNOSIS — T7840XA Allergy, unspecified, initial encounter: Secondary | ICD-10-CM

## 2024-06-25 DIAGNOSIS — L500 Allergic urticaria: Secondary | ICD-10-CM | POA: Insufficient documentation

## 2024-06-25 LAB — CBC WITH DIFFERENTIAL/PLATELET
Basophils Absolute: 0 K/uL (ref 0.0–0.1)
Basophils Relative: 1 %
Eosinophils Absolute: 0.1 K/uL (ref 0.0–0.5)
Eosinophils Relative: 4 %
HCT: 31.4 % — ABNORMAL LOW (ref 36.0–46.0)
Hemoglobin: 9.7 g/dL — ABNORMAL LOW (ref 12.0–15.0)
Lymphocytes Relative: 44 %
Lymphs Abs: 1.3 K/uL (ref 0.7–4.0)
MCH: 21.9 pg — ABNORMAL LOW (ref 26.0–34.0)
MCHC: 30.9 g/dL (ref 30.0–36.0)
MCV: 70.9 fL — ABNORMAL LOW (ref 80.0–100.0)
Monocytes Absolute: 0.2 K/uL (ref 0.1–1.0)
Monocytes Relative: 8 %
Neutro Abs: 1.3 K/uL — ABNORMAL LOW (ref 1.7–7.7)
Neutrophils Relative %: 43 %
Platelets: 236 K/uL (ref 150–400)
RBC: 4.43 MIL/uL (ref 3.87–5.11)
RDW: 17.4 % — ABNORMAL HIGH (ref 11.5–15.5)
Smear Review: NORMAL
WBC: 3 K/uL — ABNORMAL LOW (ref 4.0–10.5)
nRBC: 0 % (ref 0.0–0.2)

## 2024-06-25 LAB — CK: Total CK: 85 U/L (ref 38–234)

## 2024-06-25 LAB — COMPREHENSIVE METABOLIC PANEL WITH GFR
ALT: 18 U/L (ref 0–44)
AST: 20 U/L (ref 15–41)
Albumin: 3.2 g/dL — ABNORMAL LOW (ref 3.5–5.0)
Alkaline Phosphatase: 34 U/L — ABNORMAL LOW (ref 38–126)
Anion gap: 10 (ref 5–15)
BUN: 15 mg/dL (ref 6–20)
CO2: 19 mmol/L — ABNORMAL LOW (ref 22–32)
Calcium: 9.1 mg/dL (ref 8.9–10.3)
Chloride: 107 mmol/L (ref 98–111)
Creatinine, Ser: 0.73 mg/dL (ref 0.44–1.00)
GFR, Estimated: >60 mL/min (ref >60)
Glucose, Bld: 83 mg/dL (ref 70–99)
Potassium: 3.6 mmol/L (ref 3.5–5.1)
Sodium: 136 mmol/L (ref 135–145)
Total Bilirubin: 0.2 mg/dL (ref 0.0–1.2)
Total Protein: 6.6 g/dL (ref 6.5–8.1)

## 2024-06-25 MED ORDER — LACTATED RINGERS IV SOLN: INTRAVENOUS | Status: DC

## 2024-06-25 MED ORDER — PREDNISONE 20 MG PO TABS
60.0000 mg | ORAL_TABLET | Freq: Once | ORAL | Status: AC
  Administered 2024-06-25: 60 mg via ORAL
  Filled 2024-06-25: qty 3

## 2024-06-25 MED ORDER — CIPROFLOXACIN HCL 750 MG PO TABS: 750.0000 mg | ORAL_TABLET | Freq: Two times a day (BID) | ORAL | 0 refills | Status: AC

## 2024-06-25 MED ORDER — OXYCODONE-ACETAMINOPHEN 5-325 MG PO TABS
2.0000 | ORAL_TABLET | Freq: Once | ORAL | Status: AC
  Administered 2024-06-25: 2 via ORAL
  Filled 2024-06-25: qty 2

## 2024-06-25 MED ORDER — LINEZOLID 600 MG PO TABS: 600.0000 mg | ORAL_TABLET | Freq: Two times a day (BID) | ORAL | 0 refills | Status: AC

## 2024-06-25 MED ORDER — FAMOTIDINE 20 MG PO TABS
40.0000 mg | ORAL_TABLET | Freq: Once | ORAL | Status: AC
  Administered 2024-06-25: 40 mg via ORAL
  Filled 2024-06-25: qty 2

## 2024-06-25 NOTE — ED Triage Notes (Addendum)
 Patient BIB [facility] from home for allergic reaction to the antibiotics she is getting through her PICC line. Patient has been on abx since Nov 8th but only started having rxn x 3 days ago, benedryl provides minimal relief and has affected her ability to receive scheduled doses of her abx. VSS, PICC line in places, airway clear, no SHOB or wheezing, only rash on L arm and leg. Patient is on cefepime  and daptomycin .

## 2024-06-25 NOTE — ED Provider Notes (Addendum)
 [place] EMERGENCY DEPARTMENT AT [facility] Provider Note   CSN: [id number] Arrival date & time: 06/25/24  1412     Patient presents with: Allergic Reaction   Laura Mejia is a 38 y.o. female.   38 year old female currently being treated for discitis with cefepime  and daptomycin  presents with 3 days of recurrent skin rash with hives.  Rash has been on her thighs as well as her forearm.  Denies any oral involvement.  States has been using Benadryl with some relief.  Denies any trouble swallowing.  Has not had any wheezing.  According to old record review she is scheduled to complete her treatment on December 22.  Denies any other allergen exposure.       Prior to Admission medications   Medication Sig Start Date End Date Taking? Authorizing Provider  acetaminophen  (TYLENOL ) 500 MG tablet Take 2 tablets (1,000 mg total) by mouth every 8 (eight) hours for 14 days. 06/15/24 06/29/24  Georgina Ozell LABOR, MD  ceFEPime  (MAXIPIME ) IVPB Inject 2 g into the vein every 8 (eight) hours. Indication:  Discitis  First Dose: Yes Last Day of Therapy:  07/15/24  Labs - Once weekly:  CBC/D and BMP, Labs - Once weekly: ESR and CRP Method of administration: IV Push Method of administration may be changed at the discretion of home infusion pharmacist based upon assessment of the patient and/or caregiver's ability to self-administer the medication ordered. 06/04/24 07/16/24  Samtani, Jai-Gurmukh, MD  cyclobenzaprine  (FLEXERIL ) 10 MG tablet Take 1 tablet (10 mg total) by mouth 3 (three) times daily as needed (pain, muscle spasms). 06/15/24   Georgina Ozell LABOR, MD  daptomycin  (CUBICIN ) IVPB Inject 700 mg into the vein daily. Indication:  Discitis  First Dose: Yes Last Day of Therapy:  07/15/24  Labs - Once weekly:  CBC/D, BMP, and CPK Labs - Once weekly: ESR and CRP Method of administration: IV Push Method of administration may be changed at the discretion of home infusion pharmacist based  upon assessment of the patient and/or caregiver's ability to self-administer the medication ordered. 06/04/24 07/16/24  Samtani, Jai-Gurmukh, MD  diclofenac  Sodium (VOLTAREN  ARTHRITIS PAIN) 1 % GEL Apply 2 g topically 4 (four) times daily. 06/04/24   Samtani, Jai-Gurmukh, MD  gabapentin  (NEURONTIN ) 300 MG capsule Take 1 capsule (300 mg total) by mouth 3 (three) times daily. 06/17/24 07/17/24  Georgina Ozell LABOR, MD  hydrOXYzine  (ATARAX ) 10 MG tablet Take 1 tablet (10 mg total) by mouth 3 (three) times daily as needed for anxiety. 06/04/24   Samtani, Jai-Gurmukh, MD  naproxen  (NAPROSYN ) 500 MG tablet Take 1 tablet (500 mg total) by mouth 2 (two) times daily with a meal. 06/15/24 07/15/24  Georgina Ozell LABOR, MD  semaglutide -weight management (WEGOVY ) 0.25 MG/0.5ML SOAJ SQ injection Inject 0.25 mg into the skin once a week for 28 days. 06/15/24 07/13/24  Francyne Romano, MD    Allergies: Patient has no known allergies.    Review of Systems  All other systems reviewed and are negative.   Updated Vital Signs BP 129/88   Pulse 81   Temp 97.8 F (36.6 C) (Oral)   Resp (!) 21   LMP 05/30/2024 (Approximate)   SpO2 100%   Physical Exam Vitals and nursing note reviewed.  Constitutional:      General: She is not in acute distress.    Appearance: Normal appearance. She is well-developed. She is not toxic-appearing.  HENT:     Head: Normocephalic and atraumatic.     Mouth/Throat:  Comments: No oral involvement Eyes:     General: Lids are normal.     Conjunctiva/sclera: Conjunctivae normal.     Pupils: Pupils are equal, round, and reactive to light.  Neck:     Thyroid: No thyroid mass.     Trachea: No tracheal deviation.  Cardiovascular:     Rate and Rhythm: Normal rate and regular rhythm.     Heart sounds: Normal heart sounds. No murmur heard.    No gallop.  Pulmonary:     Effort: Pulmonary effort is normal. No respiratory distress.     Breath sounds: Normal breath sounds. No stridor.  No decreased breath sounds, wheezing, rhonchi or rales.  Abdominal:     General: There is no distension.     Palpations: Abdomen is soft.     Tenderness: There is no abdominal tenderness. There is no rebound.  Musculoskeletal:        General: No tenderness. Normal range of motion.     Cervical back: Normal range of motion and neck supple.  Skin:    General: Skin is warm and dry.     Findings: No abrasion or rash.     Comments: Left forearm with some urticaria.  Neurological:     Mental Status: She is alert and oriented to person, place, and time. Mental status is at baseline.     GCS: GCS eye subscore is 4. GCS verbal subscore is 5. GCS motor subscore is 6.     Cranial Nerves: No cranial nerve deficit.     Sensory: No sensory deficit.     Motor: Motor function is intact.  Psychiatric:        Attention and Perception: Attention normal.        Speech: Speech normal.        Behavior: Behavior normal.     (all labs ordered are listed, but only abnormal results are displayed) Labs Reviewed - No data to display  EKG: None  Radiology: No results found.   Procedures   Medications Ordered in the ED  oxyCODONE -acetaminophen  (PERCOCET/ROXICET) 5-325 MG per tablet 2 tablet (has no administration in time range)                                    Medical Decision Making Amount and/or Complexity of Data Reviewed Labs: ordered.  Risk Prescription drug management.  Patient has no signs of severe systemic allergic reaction at this time.  Only has a mild rash.  She has no itching at this time.  Will give dose of steroids here.  Will also give Pepcid. Discussed case with infectious disease doctor on-call, Dr. For sterile.  Recommends patient be placed on Cipro  750 twice daily and linezolid 600 mg twice daily.  Will DC other medications.  He discussed this with the patient via phone.  Infectious disease will follow-up with the patient     Final diagnoses:  None    ED Discharge  Orders     None          Dasie Faden, MD 06/25/24 1733    Dasie Faden, MD 06/25/24 1734

## 2024-06-25 NOTE — Telephone Encounter (Signed)
 Dr Dasie called from ED. I discussed with patient. Pt having hives with infusions for last 3 days. SHe cannot tell which medicine is causing it. Taking cefepime  and daptomycin .  No resp sxs, oral or lip swelling. Responding to benadryl. Has been on pain meds as well but no new meds in last few weeks. Cx blood and disc aspirate neg from 11/7.  Day 22 or so of IV abx treatment  Rec  Check labs cbc cmet and ck Will have her hold cefepime  and daptomycin ,  Start oral linezolid  600 mg po bid and ciprofloxacin  750 mg po bid  for now. Please send in 1 weeks supply Will discuss with primary ID Team Monday  Can likely rechallenge with daptomycin  on Monday.

## 2024-06-25 NOTE — Telephone Encounter (Signed)
 Montie, this pt was in ED -see my recs. Labs pending and I will folllowup with them but having her hold dapto and cefepime  but suspect cefepime  culprit. You see her this week. Thanks Deatrice

## 2024-06-25 NOTE — Discharge Instructions (Signed)
 Use Benadryl as directed if you have any itching.  Stop taking your other antibiotics.  Only take the oral medications that you are prescribed today.  Follow-up in the infectious disease clinic

## 2024-06-27 ENCOUNTER — Telehealth: Payer: Self-pay | Admitting: Pharmacist

## 2024-06-27 NOTE — Telephone Encounter (Signed)
 Please see message below from [facility] pharmacist. Dr. Epifanio sent you a message as well.   On Friday Laura Mejia Aug 01, 1985 call us  at the pharmacy to report itching/rash over the past 3 days or so and she felt it was caused by her cefepime /daptomycin . I told pt that it was unlikely due to these abx since she had been tolerating them fine for several weeks before the rash appears. I instructed the pt to try an alleviate symptoms with antihistamines over the weekend and consider being seen in person if itching becomes unbearable/ any difficulty breathing occurred.   In a refill call today, the patient informed us  that she went to the ED over the weekend and had been switched to oral antibiotics but still had her picc line in. I wanted to confirm that [facility] was aware and that we did not need to send out any more IV antibiotics to Laura Mejia at this point.  Please let us  know if you would like her to re-try cefepime  and daptomycin  so that the [facility] team can send out her medications.   Alan Geralds, PharmD, CPP, BCIDP, AAHIVP Clinical Pharmacist Practitioner Infectious Diseases Clinical Pharmacist [facility]

## 2024-06-29 ENCOUNTER — Encounter: Payer: Self-pay | Admitting: Orthopedic Surgery

## 2024-06-29 MED ORDER — OXYCODONE HCL 5 MG PO TABS: 5.0000 mg | ORAL_TABLET | ORAL | 0 refills | Status: AC | PRN

## 2024-07-01 ENCOUNTER — Inpatient Hospital Stay: Admitting: Internal Medicine

## 2024-07-07 ENCOUNTER — Ambulatory Visit: Admitting: Internal Medicine

## 2024-07-07 ENCOUNTER — Encounter: Payer: Self-pay | Admitting: Internal Medicine

## 2024-07-07 ENCOUNTER — Other Ambulatory Visit: Payer: Self-pay

## 2024-07-07 VITALS — BP 135/99 | HR 94 | Temp 99.4°F | Resp 16

## 2024-07-07 DIAGNOSIS — G061 Intraspinal abscess and granuloma: Secondary | ICD-10-CM

## 2024-07-07 DIAGNOSIS — D72819 Decreased white blood cell count, unspecified: Secondary | ICD-10-CM

## 2024-07-07 DIAGNOSIS — M4626 Osteomyelitis of vertebra, lumbar region: Secondary | ICD-10-CM | POA: Diagnosis not present

## 2024-07-07 DIAGNOSIS — R110 Nausea: Secondary | ICD-10-CM

## 2024-07-07 DIAGNOSIS — I10 Essential (primary) hypertension: Secondary | ICD-10-CM

## 2024-07-07 DIAGNOSIS — M4646 Discitis, unspecified, lumbar region: Secondary | ICD-10-CM

## 2024-07-07 DIAGNOSIS — M5416 Radiculopathy, lumbar region: Secondary | ICD-10-CM

## 2024-07-07 MED ORDER — CIPROFLOXACIN HCL 750 MG PO TABS: 750.0000 mg | ORAL_TABLET | Freq: Two times a day (BID) | ORAL | 0 refills | Status: AC

## 2024-07-07 MED ORDER — ONDANSETRON 4 MG PO TBDP: 4.0000 mg | ORAL_TABLET | Freq: Three times a day (TID) | ORAL | 1 refills | Status: AC | PRN

## 2024-07-07 MED ORDER — GABAPENTIN 300 MG PO CAPS: 300.0000 mg | ORAL_CAPSULE | Freq: Three times a day (TID) | ORAL | 3 refills | Status: DC

## 2024-07-07 MED ORDER — LINEZOLID 600 MG PO TABS: 600.0000 mg | ORAL_TABLET | Freq: Two times a day (BID) | ORAL | 0 refills | Status: AC

## 2024-07-07 NOTE — Progress Notes (Signed)
 Patient ID: Laura Mejia, female   DOB: 12/05/1985, 38 y.o.   MRN: [id number]  HPI Laura Mejia is a 38yo F with history of depression/anxiety, HTN, GERd who started to have lumbar radiculopathy since mid July. She ultimately was treated with epidural injection on 9/17 but still had very little relief. MRI of lumbar spine showed L5/S1 discitis and slight ventral epidural abscess. Undernwent IR aspiration but cultures no growth. She was started on vancomycin  and cefepime  on 11/4, then changed to daptomycin  and cefepime  on discharge. She started to develop pruritic rash, diffuse in nature and then went to the ED for evaluation. She was given Iv dose of steroids with instructions to continue with benadryl and switched to linezolid  plus cipro  - she finished 5 days ago. She states rash is less itchy still some evidence of it. Mainly, she stil has ongoing symptoms of radiculopathy and numbness to right toes at time. She still has picc line in place, was to initially be finished iwht iv abtx on 12/19.      MRI L spine  1. Findings compatible with L5-S1 discitis/osteomyelitis, detailed above. 2. Slight (3 mm thick) ventral epidural enhancement is suspicious for early epidural phlegmon. 3. Mild paraspinal edema/cellulitis at L5-S1. No discrete, drainable fluid collection.    Only takes neurontin  300mg  at bedtime; burning of toes right/left foot all the time; calf pain & back pain radiates down legs intermittently  No different from when she was first hospitalized   Outpatient Encounter Medications as of 07/07/2024  Medication Sig   gabapentin  (NEURONTIN ) 300 MG capsule Take 1 capsule (300 mg total) by mouth 3 (three) times daily.   hydrOXYzine  (ATARAX ) 10 MG tablet Take 1 tablet (10 mg total) by mouth 3 (three) times daily as needed for anxiety.   naproxen  (NAPROSYN ) 500 MG tablet Take 1 tablet (500 mg total) by mouth 2 (two) times daily with a meal.   semaglutide -weight management  (WEGOVY ) 0.25 MG/0.5ML SOAJ SQ injection Inject 0.25 mg into the skin once a week for 28 days.   diclofenac  Sodium (VOLTAREN  ARTHRITIS PAIN) 1 % GEL Apply 2 g topically 4 (four) times daily. (Patient not taking: Reported on 07/07/2024)   No facility-administered encounter medications on file as of 07/07/2024.     Patient Active Problem List   Diagnosis Date Noted   Septic discitis of lumbar region 05/30/2024   Iron deficiency anemia 11/30/2023   Vitamin D  deficiency 11/30/2023   Insulin  resistance 11/30/2023   Essential (primary) hypertension 11/16/2023   Class 2 obesity due to excess calories without serious comorbidity with body mass index (BMI) of 37.0 to 37.9 in adult 11/16/2023   Suspected sleep apnea 11/16/2023   Chest pain of uncertain etiology 04/10/2021   PAC (premature atrial contraction) 10/20/2011   Palpitations 09/22/2011   Murmur, cardiac 09/22/2011     Health Maintenance Due  Topic Date Due   Hepatitis C Screening  Never done   DTaP/Tdap/Td (1 - Tdap) Never done   Hepatitis B Vaccines 19-59 Average Risk (1 of 3 - 19+ 3-dose series) Never done   HPV VACCINES (1 - 3-dose SCDM series) Never done   Cervical Cancer Screening (HPV/Pap Cotest)  02/16/2017   COVID-19 Vaccine (3 - Pfizer risk series) 06/21/2020   Influenza Vaccine  Never done     Review of Systems 12 point ros is otherwise negative except what is mentioned above Physical Exam   BP (!) 141/92   Pulse (!) 116   Temp 99.4  F (37.4 C) (Oral)   Resp 16   SpO2 99%    Physical Exam  Constitutional:  oriented to person, place, and time. appears well-developed and well-nourished. No distress.  HENT: [place]/AT, PERRLA, no scleral icterus Mouth/Throat: Oropharynx is clear and moist. No oropharyngeal exudate.  Cardiovascular: Normal rate, regular rhythm and normal heart sounds. Exam reveals no gallop and no friction rub.  No murmur heard.  Pulmonary/Chest: Effort normal and breath sounds normal. No respiratory  distress.  has no wheezes.  Skin: Skin is warm and dry. No rash noted. No erythema.  Psychiatric: a normal mood and affect.  behavior is normal.   Lab Results  Component Value Date   LABRPR NON REACTIVE 01/11/2007    CBC Lab Results  Component Value Date   WBC 3.0 (L) 06/25/2024   RBC 4.43 06/25/2024   HGB 9.7 (L) 06/25/2024   HCT 31.4 (L) 06/25/2024   PLT 236 06/25/2024   MCV 70.9 (L) 06/25/2024   MCH 21.9 (L) 06/25/2024   MCHC 30.9 06/25/2024   RDW 17.4 (H) 06/25/2024   LYMPHSABS 1.3 06/25/2024   MONOABS 0.2 06/25/2024   EOSABS 0.1 06/25/2024    BMET Lab Results  Component Value Date   NA 136 06/25/2024   K 3.6 06/25/2024   CL 107 06/25/2024   CO2 19 (L) 06/25/2024   GLUCOSE 83 06/25/2024   BUN 15 06/25/2024   CREATININE 0.73 06/25/2024   CALCIUM 9.1 06/25/2024   GFRNONAA >60 06/25/2024   GFRAA >90 09/09/2011      Assessment and Plan Lumbar discitis/om/small ventral epidural abscess= has been on abtx for nearly 4 wks. We will plan to do  Lumbar mri repeat imaging since still not improved with radiculopathy - to see dr georgina on 12/22 Extend abtx--linezolid  600 mg po bid and ciprofloxacin  750 mg po bid  x 2 wk to complete course  Leukopenia = will check cbc with diff  Long term medicaiton management= will check sed rate and crp and bmp  Pull picc line since not re-initiating iv abtx. Though it maybe worth in the future to rechallenge daptomycin  and cefepime  in staggered fashion to see which may have been associated with rash.  Lumbar radiculopathy = increase her gabapentin  from daily to TID titrating up per  5-7 days; refill gabapentin    Nausea - give refills  Htn  = improved on repeat reading. Suspect back pain is also contributing it to be elevated above goal  Rtc in 2-3 wk to evaluate if need to extend abtx based on imaging, labs and presentation

## 2024-07-07 NOTE — Progress Notes (Signed)
 PICC Removal   Attempted lab draw, no blood return. Lab will need to do peripheral stick.   PICC length & location: right basilic 36 cm Removed per verbal order from: Dr. Luiz  Blood thinners: none per chart review Platelet count: 370 (12/3 [facility])  Site assessment: Dressing clean and dry. Extremity warm and dry. No drainage or swelling present at insertion site. Mild erythema about 0.5 cm in diameter, appears to be mechanical irritation from catheter. Instructed patient to monitor and notify clinic if erythema worsens or if she develops drainage, fever, chills, swelling, or red streaking.   Pre-removal vital signs:  BP: 141/92 HR: 116 SpO2: 99%  Insertion site positioned below level of heart. No sutures present. Insertion site cleaned with CHG, catheter removed and petroleum dressing applied. Tip intact. Pressure held until hemostasis achieved.    Length of catheter removed: 36 cm   Provided patient with after care instructions and precautions print out (via [employer] Clinical Key). Reviewed this information with patient.   Patient verbalized understanding and agreement, all questions answered. Patient tolerated procedure well and remained in clinic under the care of RN 30 minutes post removal.  Post-observation vital signs:  BP: 135/99 HR: 94 SpO2: 98%  Notified Holley Herring, RN with [facility] and [facility] pharmacy team of removal.  Duwaine Lowe, BSN, RN

## 2024-07-08 LAB — BASIC METABOLIC PANEL WITH GFR
BUN: 12 mg/dL (ref 7–25)
CO2: 23 mmol/L (ref 20–32)
Calcium: 9.5 mg/dL (ref 8.6–10.2)
Chloride: 106 mmol/L (ref 98–110)
Creat: 0.81 mg/dL (ref 0.50–0.97)
Glucose, Bld: 88 mg/dL (ref 65–99)
Potassium: 3.8 mmol/L (ref 3.5–5.3)
Sodium: 139 mmol/L (ref 135–146)
eGFR: 95 mL/min/1.73m2 (ref >=60)

## 2024-07-08 LAB — CBC WITH DIFFERENTIAL/PLATELET
Absolute Lymphocytes: 2813 {cells}/uL (ref 850–3900)
Absolute Monocytes: 794 {cells}/uL (ref 200–950)
Basophils Absolute: 127 {cells}/uL (ref 0–200)
Basophils Relative: 1.3 %
Eosinophils Absolute: 176 {cells}/uL (ref 15–500)
Eosinophils Relative: 1.8 %
HCT: 36 % (ref 35.9–46.0)
Hemoglobin: 10.8 g/dL — ABNORMAL LOW (ref 11.7–15.5)
MCH: 21.9 pg — ABNORMAL LOW (ref 27.0–33.0)
MCHC: 30 g/dL — ABNORMAL LOW (ref 31.6–35.4)
MCV: 72.9 fL — ABNORMAL LOW (ref 81.4–101.7)
MPV: 9.3 fL (ref 7.5–12.5)
Monocytes Relative: 8.1 %
Neutro Abs: 5890 {cells}/uL (ref 1500–7800)
Neutrophils Relative %: 60.1 %
Platelets: 452 Thousand/uL — ABNORMAL HIGH (ref 140–400)
RBC: 4.94 Million/uL (ref 3.80–5.10)
RDW: 17.1 % — ABNORMAL HIGH (ref 11.0–15.0)
Total Lymphocyte: 28.7 %
WBC: 9.8 Thousand/uL (ref 3.8–10.8)

## 2024-07-08 LAB — C-REACTIVE PROTEIN: CRP: <3 mg/L (ref <8.0)

## 2024-07-08 LAB — SEDIMENTATION RATE: Sed Rate: 33 mm/h — ABNORMAL HIGH (ref 0–20)

## 2024-07-09 ENCOUNTER — Ambulatory Visit (HOSPITAL_COMMUNITY)
Admission: RE | Admit: 2024-07-09 | Discharge: 2024-07-09 | Disposition: A | Source: Ambulatory Visit | Attending: Internal Medicine | Admitting: Internal Medicine

## 2024-07-09 DIAGNOSIS — M4646 Discitis, unspecified, lumbar region: Secondary | ICD-10-CM

## 2024-07-09 MED ORDER — GADOBUTROL 1 MMOL/ML IV SOLN
8.0000 mL | Freq: Once | INTRAVENOUS | Status: AC | PRN
  Administered 2024-07-09: 8 mL via INTRAVENOUS

## 2024-07-11 ENCOUNTER — Encounter: Payer: Self-pay | Admitting: Orthopedic Surgery

## 2024-07-11 MED ORDER — OXYCODONE HCL 5 MG PO TABS: 5.0000 mg | ORAL_TABLET | ORAL | 0 refills | Status: AC | PRN

## 2024-07-12 ENCOUNTER — Other Ambulatory Visit: Payer: Self-pay | Admitting: Physician Assistant

## 2024-07-18 ENCOUNTER — Ambulatory Visit: Admitting: Orthopedic Surgery

## 2024-07-18 ENCOUNTER — Other Ambulatory Visit (INDEPENDENT_AMBULATORY_CARE_PROVIDER_SITE_OTHER): Payer: Self-pay

## 2024-07-18 DIAGNOSIS — M545 Low back pain, unspecified: Secondary | ICD-10-CM | POA: Diagnosis not present

## 2024-07-18 NOTE — Progress Notes (Signed)
 Orthopedic Spine Surgery Office Note   Assessment: Patient is a 38 y.o. female with low back pain and right hip pain. No pain radiating past the hip. Has L5/S1 osteomyelitis/discitis     Plan: -I do not see any significant stenosis around the cauda equina noted consistent with her episodes of incontinence that she reports.  It sounds almost like this is more of a functional incontinence due to lack of ability to mobilize due to the pain -She does have pain radiating in an L5 distribution and has developed bilateral foraminal stenosis.  Given the severity of the pain and duration, surgery would be an option.  Would like to see her inflammatory markers normalized and remained normal after antibiotic course.  Ordered ESR and CRP for today.  Will order them at our next visit to ensure that they have normalized after completing antibiotic course -She can increase her gabapentin  up to 600mg  TID gradually. Provided her with a plan to increase gradually -Given the severity of the pain and the inability to mobilize more than 20 feet, I do not feel that she should return to work at this time -I told her that even with surgical management I do not think that she would get complete pain relief.  She still has significant pain in July when her MRI did not show any significant stenosis -Patient can be out of bed as tolerated without any brace -Patient should return to the office in 4 weeks. X-rays at next visit: none     Patient expressed understanding of the plan and all questions were answered to the patient's satisfaction.    ___________________________________________________________________________     History:   Patient is a 38 y.o. female who presents today for follow up on her lumbar spine.  Patient is still having significant low back pain.  She is starting develop radiating leg pain.  She feels the pain going into the posterior lateral aspect of her thighs and legs.  She has developed burning pain  in her toes.  She said she has difficulty going to the bathroom and has had episodes of incontinence.  She is still on a course of antibiotics.  She is taking multiple medications to control pain.  She is walking very short distances around the house but otherwise unable to walk longer than that.  She is using her rollator.   Treatments tried: PT, celebrex , tizanidine , gabapentin , oral steroids, lumbar steroid injection     Physical Exam:    General: no acute distress, appears stated age Neurologic: alert, answering questions appropriately, following commands Respiratory: unlabored breathing on room air, symmetric chest rise Psychiatric: appropriate affect, normal cadence to speech     MSK (spine):   -Strength exam                                                   Left                  Right EHL                              5/5                  5/5 TA  5/5                  5/5 GSC                             5/5                  5/5 Knee extension            5/5                  5/5 Hip flexion                    5/5                  5/5   -Sensory exam                           Sensation intact to light touch in L3-S1 nerve distributions of bilateral lower extremities   Imaging: XRs of the lumbar spine from 07/18/2024 were independently reviewed and interpreted, showing disc height loss at L5/S1.  There is been interval height loss since prior films on 06/09/2024.  No other significant degenerative changes seen.  No fracture dislocation seen.  MRI of the lumbar spine from 07/09/2024 was independently reviewed and interpreted, showing decreased T2 signal in the L5/S1 disc since prior MRI on 05/30/2024.  Significant DDD at L5/S1.  Disc desiccation at L4/5.  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     Patient name: Laura Mejia Patient MRN: [id number] Date of visit: 07/18/2024

## 2024-07-19 LAB — SEDIMENTATION RATE: Sed Rate: 38 mm/h — ABNORMAL HIGH (ref 0–20)

## 2024-07-19 LAB — C-REACTIVE PROTEIN: CRP: <3 mg/L

## 2024-07-27 ENCOUNTER — Ambulatory Visit (INDEPENDENT_AMBULATORY_CARE_PROVIDER_SITE_OTHER): Admitting: Infectious Diseases

## 2024-07-27 ENCOUNTER — Encounter: Payer: Self-pay | Admitting: Orthopedic Surgery

## 2024-07-27 ENCOUNTER — Other Ambulatory Visit: Payer: Self-pay

## 2024-07-27 ENCOUNTER — Encounter: Payer: Self-pay | Admitting: Infectious Diseases

## 2024-07-27 VITALS — BP 136/90 | HR 99 | Temp 98.1°F | Ht 61.0 in | Wt 180.0 lb

## 2024-07-27 DIAGNOSIS — M4627 Osteomyelitis of vertebra, lumbosacral region: Secondary | ICD-10-CM

## 2024-07-27 DIAGNOSIS — Z5181 Encounter for therapeutic drug level monitoring: Secondary | ICD-10-CM | POA: Insufficient documentation

## 2024-07-27 DIAGNOSIS — M5416 Radiculopathy, lumbar region: Secondary | ICD-10-CM | POA: Insufficient documentation

## 2024-07-27 DIAGNOSIS — M4647 Discitis, unspecified, lumbosacral region: Secondary | ICD-10-CM | POA: Diagnosis not present

## 2024-07-27 DIAGNOSIS — M4646 Discitis, unspecified, lumbar region: Secondary | ICD-10-CM

## 2024-07-27 MED ORDER — OXYCODONE HCL 5 MG PO TABS: 5.0000 mg | ORAL_TABLET | ORAL | 0 refills | Status: AC | PRN

## 2024-07-27 NOTE — Progress Notes (Signed)
 "  Patient's Medications  New Prescriptions   No medications on file  Previous Medications   CIPROFLOXACIN  (CIPRO ) 750 MG TABLET    Take 1 tablet (750 mg total) by mouth 2 (two) times daily.   DICLOFENAC  SODIUM (VOLTAREN  ARTHRITIS PAIN) 1 % GEL    Apply 2 g topically 4 (four) times daily.   GABAPENTIN  (NEURONTIN ) 300 MG CAPSULE    TAKE 1 CAPSULE BY MOUTH EVERYDAY AT BEDTIME   HYDROXYZINE  (ATARAX ) 10 MG TABLET    Take 1 tablet (10 mg total) by mouth 3 (three) times daily as needed for anxiety.   LINEZOLID  (ZYVOX ) 600 MG TABLET    Take 1 tablet (600 mg total) by mouth 2 (two) times daily.   ONDANSETRON  (ZOFRAN -ODT) 4 MG DISINTEGRATING TABLET    Take 1 tablet (4 mg total) by mouth every 8 (eight) hours as needed for nausea or vomiting.  Modified Medications   No medications on file  Discontinued Medications   No medications on file    Subjective: 38 year old female with prior history of anxiety/depression, HTN, GERD, lumbar radiculopathy for which she was treated with epidural injection on 9/17, followed by admission 11/3-11/8 MRI L-spine showed L5-S1 discitis and slight ventral epidural abscess.  Underwent IR aspiration 11/4 with negative cultures.  She was discharged on daptomycin  and cefepime  to complete 6 weeks course.  She started to develop pruritic rash, diffuse and was seen in the ED 11/29-was given IV dose of steroid, continue Benadryl and IV antibiotics switched to linezolid  and ciprofloxacin .   Seen by Dr Luiz 12/11 and MRI L-spine repeated, linezolid  and ciprofloxacin  was extended for 2 more weeks.  She had completed linezolid /ciprofloxacin  4 to 5 days ago during that visit.   Seen by Dr Georgina 12/22 who instructed on gradually increasing dose of gabapentin  and possible plan for surgery for radiculopathy at a later date.   12/31 Completed p.o. linezolid  and ciprofloxacin  on 12/27. Denies missing doses or any concerns with antibiotics . Low back pain is better with increased dose of  gabapentin . Still part of rt foot feels numb/burning. Dr Georgina have instructed on how to increase dose of gabapentin . No other concerns.   Review of Systems: All systems reviewed with pertinent positive and negative as listed above  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)    Depression    Diverticulitis    Gallbladder problem    GERD (gastroesophageal reflux disease)    Heart murmur    Palpitations    Vitamin D  deficiency    Past Surgical History:  Procedure Laterality Date   CESAREAN SECTION     CHOLECYSTECTOMY     IR LUMBAR DISC ASPIRATION W/IMG GUIDE  05/31/2024   IR LUMBAR DISC ASPIRATION W/IMG GUIDE  06/03/2024   RADIOLOGY WITH ANESTHESIA N/A 06/03/2024   Procedure: RADIOLOGY WITH ANESTHESIA;  Surgeon: Radiologist, Medication, MD;  Location: [facility] OR;  Service: Radiology;  Laterality: N/A;    Social History[1]  Family History  Problem Relation Age of Onset   Hypertension Mother    Stroke Mother    Depression Mother    Anxiety disorder Mother    Bipolar disorder Mother    Obesity Mother    Bone cancer Mother        10/2023 dx   Hypertension Father    Diabetes Father  Heart disease Father    Colon cancer Neg Hx    Rectal cancer Neg Hx    Stomach cancer Neg Hx    Esophageal cancer Neg Hx    Pancreatic cancer Neg Hx    Liver cancer Neg Hx     Allergies[2]  Health Maintenance  Topic Date Due   Hepatitis C Screening  Never done   DTaP/Tdap/Td (1 - Tdap) Never done   Hepatitis B Vaccines 19-59 Average Risk (1 of 3 - 19+ 3-dose series) Never done   HPV VACCINES (1 - 3-dose SCDM series) Never done   Cervical Cancer Screening (HPV/Pap Cotest)  02/16/2017   COVID-19 Vaccine (3 - Pfizer risk series) 06/21/2020   Influenza Vaccine  Never done   HIV Screening  Completed   Pneumococcal Vaccine  Aged Out   Meningococcal B Vaccine   Aged Out   Objective: BP (!) 136/90   Pulse 99   Temp 98.1 F (36.7 C) (Temporal)   Ht 5' 1 (1.549 m)   Wt 180 lb (81.6 kg)   SpO2 99%   BMI 34.01 kg/m   Physical Exam Constitutional:      Appearance: Normal appearance.  HENT:     Head: Normocephalic and atraumatic.      Mouth: Mucous membranes are moist.  Eyes:    Conjunctiva/sclera: Conjunctivae normal.     Pupils: Pupils are equal, round, and b/l symmetrical    Cardiovascular:     Rate and Rhythm: Normal rate    Heart sounds:   Pulmonary:     Effort: Pulmonary effort is normal.     Breath sounds:  Abdominal:     General: Non distended     Palpations: soft.   Musculoskeletal:        General: sitting in the wheelchair  Skin:    General: Skin is warm and dry.     Comments:  Neurological:     General:     Mental Status: awake, alert  Psychiatric:        Mood and Affect: Mood normal.   Lab Results Lab Results  Component Value Date   WBC 9.8 07/07/2024   HGB 10.8 (L) 07/07/2024   HCT 36.0 07/07/2024   MCV 72.9 (L) 07/07/2024   PLT 452 (H) 07/07/2024    Lab Results  Component Value Date   CREATININE 0.81 07/07/2024   BUN 12 07/07/2024   NA 139 07/07/2024   K 3.8 07/07/2024   CL 106 07/07/2024   CO2 23 07/07/2024    Lab Results  Component Value Date   ALT 18 06/25/2024   AST 20 06/25/2024   ALKPHOS 34 (L) 06/25/2024   BILITOT 0.2 06/25/2024    Lab Results  Component Value Date   CHOL 178 11/16/2023   HDL 59 11/16/2023   LDLCALC 98 11/16/2023   TRIG 118 11/16/2023   Lab Results  Component Value Date   LABRPR NON REACTIVE 01/11/2007   No results found for: HIV1RNAQUANT, HIV1RNAVL, CD4TABS   Microbiology Results for orders placed or performed during the hospital encounter of 05/30/24  Culture, blood (routine x 2)     Status: None   Collection Time: 05/30/24  4:33 PM   Specimen: BLOOD  Result Value Ref Range Status   Specimen Description   Final    BLOOD LEFT  ANTECUBITAL Performed at [facility], [address] [place], [place] [address]    Special Requests   Final    BOTTLES DRAWN AEROBIC AND  ANAEROBIC Blood Culture adequate volume Performed at [facility], [address], [place], [place] [address]    Culture   Final    NO GROWTH 5 DAYS Performed at [facility], [address] [place], [place] [address]    Report Status 06/04/2024 FINAL  Final  Culture, blood (routine x 2)     Status: None   Collection Time: 05/30/24  7:42 PM   Specimen: BLOOD  Result Value Ref Range Status   Specimen Description   Final    BLOOD LEFT ANTECUBITAL Performed at [facility], [address] [place], [place] [address]    Special Requests   Final    BOTTLES DRAWN AEROBIC AND ANAEROBIC Blood Culture results may not be optimal due to an inadequate volume of blood received in culture bottles Performed at [facility], [address], [place], [place] [address]    Culture   Final    NO GROWTH 5 DAYS Performed at [facility], [address] [place], [place] [address]    Report Status 06/05/2024 FINAL  Final  Aerobic/Anaerobic Culture w Gram Stain (surgical/deep wound)     Status: None   Collection Time: 06/03/24  3:05 PM   Specimen: Wound  Result Value Ref Range Status   Specimen Description WOUND  Final   Special Requests INTERVERTEBRAL DISC ASPIRATION  Final   Gram Stain   Final    ABUNDANT WBC PRESENT, PREDOMINANTLY PMN NO ORGANISMS SEEN Gram Stain Report Called to,Read Back By and Verified With: RN FABIENE GONE [phone] @ [address]    Culture   Final    No growth aerobically or anaerobically. Performed at [facility], [address] [place], [place] [address]    Report Status 06/08/2024 FINAL  Final   Imaging XR Lumbar Spine 2-3 Views Result Date: 07/18/2024 XRs of the lumbar spine from 07/18/2024 were independently reviewed and interpreted,  showing disc height loss at L5/S1.  There is been interval height loss since prior films on 06/09/2024.  No other significant degenerative changes seen.  No fracture dislocation seen.  MR Lumbar Spine W Wo Contrast Result Date: 07/12/2024 CLINICAL DATA:  Discitis EXAM: MRI LUMBAR SPINE WITHOUT AND WITH CONTRAST TECHNIQUE: Multiplanar and multiecho pulse sequences of the lumbar spine were obtained without and with intravenous contrast. CONTRAST:  8mL GADAVIST  GADOBUTROL  1 MMOL/ML IV SOLN COMPARISON:  05/30/2024 FINDINGS: Bone marrow: There is edema and enhancement in the vertebral endplates at L5-S1 Conus and cauda equina: No intrathecal enhancement Paraspinal tissues: No significant abnormality L1-L2: Normal L2-L3: Normal L3-L4: Normal L4-L5: There is desiccation of the disc with a mild disc bulge. Mild facet arthropathy. L5-S1: There is collapse of the disc space with enhancement and edema in the vertebral endplates. There is no epidural fluid collection. No paraspinal abscess. IMPRESSION: Discitis/osteomyelitis at L5-S1. There is complete collapse of the disc space without collapse of the vertebral endplates. There is decreased fluid in the disc compared with the prior study. There is no epidural or paraspinal abscess. Electronically Signed   By: Nancyann Burns M.D.   On: 07/12/2024 11:18   Assessment/Plan # L5-S1 discitis and osteomyelitis  - Underwent IR aspiration 11/4 with negative cultures - 11/29 Daptomycin  and cefepime  switched to ciprofloxacin  and linezolid  for 2 weeks, then refilled 2 weeks of linezolid  and ciprofloxacin  which completed 12/27 - 12/16 MRI l spine findings reviewed and no new signs of infection, possible evolutionary changes of infection  -  Clinically improved  - ESR 57>33>38, CRP 5.1> <3  Plan  - monitor off antibiotics  - no need for labs  - fu in a month   # Lumbar radiculopathy - continue gabapentin  as instructed by Dr Georgina instructions  - fu with Dr Georgina, possible  plans for surgery noted   I personally spent a total of 30 minutes in the care of the patient today including reviewing notes from Dr Luiz, Dr Georgina, Discharge summary 11/8,  preparing to see the patient, getting/reviewing separately obtained history, performing a medically appropriate exam/evaluation, counseling and educating, documenting clinical information in the EHR, independently interpreting results, and communicating results.   Annalee Joseph, MD [facility] 07/27/2024, 11:10 AM     [1]  Social History Tobacco Use   Smoking status: Never   Smokeless tobacco: Never  Vaping Use   Vaping status: Never Used  Substance Use Topics   Alcohol use: Not Currently    Comment: occ   Drug use: No  [2] No Known Allergies  "

## 2024-07-31 ENCOUNTER — Encounter (INDEPENDENT_AMBULATORY_CARE_PROVIDER_SITE_OTHER): Payer: Self-pay | Admitting: Internal Medicine

## 2024-08-01 ENCOUNTER — Encounter (INDEPENDENT_AMBULATORY_CARE_PROVIDER_SITE_OTHER): Payer: Self-pay

## 2024-08-01 DIAGNOSIS — E66812 Obesity, class 2: Secondary | ICD-10-CM

## 2024-08-01 MED ORDER — WEGOVY 1 MG/0.5ML ~~LOC~~ SOAJ: 1.0000 mg | SUBCUTANEOUS | 0 refills | Status: DC

## 2024-08-03 ENCOUNTER — Other Ambulatory Visit (INDEPENDENT_AMBULATORY_CARE_PROVIDER_SITE_OTHER): Payer: Self-pay

## 2024-08-03 DIAGNOSIS — E6609 Other obesity due to excess calories: Secondary | ICD-10-CM

## 2024-08-03 MED ORDER — WEGOVY 1 MG/0.5ML ~~LOC~~ SOAJ: 1.0000 mg | SUBCUTANEOUS | 0 refills | Status: AC

## 2024-08-15 ENCOUNTER — Ambulatory Visit: Admitting: Orthopedic Surgery

## 2024-08-15 DIAGNOSIS — M545 Low back pain, unspecified: Secondary | ICD-10-CM

## 2024-08-15 NOTE — Progress Notes (Signed)
 Orthopedic Spine Surgery Office Note   Assessment: Patient is a 39 y.o. female with low back pain. 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  Continue with pain management -Patient is still having significant pain and cannot sit for longer than 2 hours or walk greater than 30 feet, so recommend she stay out of work at this time -Patient can be out of bed as tolerated without any brace -Patient should return to the office in 6 weeks. x-rays at next visit: none     Patient expressed understanding of the plan and all questions were answered to the patient's satisfaction.    ___________________________________________________________________________     History:   Patient is a 39 y.o. female who presents today for follow up on her lumbar spine.  Patient is now established with pain management.  She increased her gabapentin  since she was last in the office.  She has noticed improvement in her symptoms.  She is no longer having any rest pain.  She now is mostly having pain if she is doing more walking or sitting for prolonged period of time.  She is no longer having pain radiating into her lower extremities.  She has been walking with a rollator.  She has completed her course of antibiotics.   Treatments tried: PT, celebrex , tizanidine , gabapentin , oral steroids, lumbar steroid injection, pain management     Physical Exam:    General: no acute distress, appears stated age Neurologic: alert, answering questions appropriately, following commands Respiratory: unlabored breathing on room air, symmetric chest rise Psychiatric: appropriate affect, normal cadence to speech     MSK (spine):    -Strength exam                                                   Left                  Right EHL                              5/5                  5/5 TA                                 5/5                  5/5 GSC                             5/5                  5/5 Knee extension            5/5                  5/5 Hip flexion  5/5                  5/5   -Sensory exam                           Sensation intact to light touch in L3-S1 nerve distributions of bilateral lower extremities   Imaging: XRs of the lumbar spine from 07/18/2024 were previously independently reviewed and interpreted, showing disc height loss at L5/S1.  There is been interval height loss since prior films on 06/09/2024.  No other significant degenerative changes seen.  No fracture dislocation seen.   MRI of the lumbar spine from 07/09/2024 was previously independently reviewed and interpreted, showing decreased T2 signal in the L5/S1 disc since prior MRI on 05/30/2024.  Significant DDD at L5/S1.  Disc desiccation at L4/5.  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     Patient name: Laura Mejia Patient MRN: [id number] Date of visit: 08/15/24

## 2024-08-17 ENCOUNTER — Ambulatory Visit: Admitting: Infectious Diseases

## 2024-10-05 ENCOUNTER — Ambulatory Visit: Admitting: Orthopedic Surgery
